# Patient Record
Sex: Female | Born: 1965
Health system: Southern US, Community
[De-identification: ages and names within clinical notes are randomized; demographics above are authoritative.]

## PROBLEM LIST (undated history)

## (undated) DIAGNOSIS — K219 Gastro-esophageal reflux disease without esophagitis: Secondary | ICD-10-CM

## (undated) DIAGNOSIS — R3915 Urgency of urination: Secondary | ICD-10-CM

## (undated) DIAGNOSIS — R319 Hematuria, unspecified: Secondary | ICD-10-CM

## (undated) DIAGNOSIS — Z87442 Personal history of urinary calculi: Secondary | ICD-10-CM

## (undated) DIAGNOSIS — K449 Diaphragmatic hernia without obstruction or gangrene: Secondary | ICD-10-CM

## (undated) DIAGNOSIS — F419 Anxiety disorder, unspecified: Secondary | ICD-10-CM

## (undated) DIAGNOSIS — N200 Calculus of kidney: Secondary | ICD-10-CM

## (undated) DIAGNOSIS — Z8489 Family history of other specified conditions: Secondary | ICD-10-CM

## (undated) HISTORY — PX: COLONOSCOPY WITH PROPOFOL: SHX5780

## (undated) HISTORY — PX: CYSTOSCOPY/RETROGRADE/URETEROSCOPY/STONE EXTRACTION WITH BASKET: SHX5317

## (undated) HISTORY — PX: KIDNEY STONE SURGERY: SHX686

## (undated) HISTORY — PX: ESOPHAGOGASTRODUODENOSCOPY: SHX1529

## (undated) HISTORY — PX: TONSILLECTOMY: SUR1361

---

## 2000-04-24 ENCOUNTER — Encounter (INDEPENDENT_AMBULATORY_CARE_PROVIDER_SITE_OTHER): Payer: Self-pay | Admitting: Specialist

## 2000-04-24 ENCOUNTER — Inpatient Hospital Stay (HOSPITAL_COMMUNITY): Admission: AD | Admit: 2000-04-24 | Discharge: 2000-04-24 | Payer: Self-pay | Admitting: Obstetrics & Gynecology

## 2000-04-24 ENCOUNTER — Encounter: Payer: Self-pay | Admitting: Obstetrics & Gynecology

## 2001-02-11 ENCOUNTER — Other Ambulatory Visit: Admission: RE | Admit: 2001-02-11 | Discharge: 2001-02-11 | Payer: Self-pay | Admitting: Obstetrics and Gynecology

## 2001-03-04 ENCOUNTER — Encounter: Payer: Self-pay | Admitting: Obstetrics and Gynecology

## 2001-03-04 ENCOUNTER — Ambulatory Visit (HOSPITAL_COMMUNITY): Admission: RE | Admit: 2001-03-04 | Discharge: 2001-03-04 | Payer: Self-pay | Admitting: Obstetrics and Gynecology

## 2001-12-19 ENCOUNTER — Inpatient Hospital Stay (HOSPITAL_COMMUNITY): Admission: AD | Admit: 2001-12-19 | Discharge: 2001-12-21 | Payer: Self-pay | Admitting: Obstetrics and Gynecology

## 2002-01-30 ENCOUNTER — Other Ambulatory Visit: Admission: RE | Admit: 2002-01-30 | Discharge: 2002-01-30 | Payer: Self-pay | Admitting: Obstetrics and Gynecology

## 2003-05-01 ENCOUNTER — Other Ambulatory Visit: Admission: RE | Admit: 2003-05-01 | Discharge: 2003-05-01 | Payer: Self-pay | Admitting: Obstetrics and Gynecology

## 2004-01-09 ENCOUNTER — Other Ambulatory Visit: Admission: RE | Admit: 2004-01-09 | Discharge: 2004-01-09 | Payer: Self-pay | Admitting: Obstetrics and Gynecology

## 2004-07-15 ENCOUNTER — Inpatient Hospital Stay (HOSPITAL_COMMUNITY): Admission: AD | Admit: 2004-07-15 | Discharge: 2004-07-18 | Payer: Self-pay | Admitting: Obstetrics and Gynecology

## 2004-08-03 HISTORY — PX: ENDOMETRIAL ABLATION: SHX621

## 2004-08-29 ENCOUNTER — Other Ambulatory Visit: Admission: RE | Admit: 2004-08-29 | Discharge: 2004-08-29 | Payer: Self-pay | Admitting: Obstetrics and Gynecology

## 2005-10-12 ENCOUNTER — Other Ambulatory Visit: Admission: RE | Admit: 2005-10-12 | Discharge: 2005-10-12 | Payer: Self-pay | Admitting: Obstetrics and Gynecology

## 2006-01-28 ENCOUNTER — Emergency Department (HOSPITAL_COMMUNITY): Admission: EM | Admit: 2006-01-28 | Discharge: 2006-01-29 | Payer: Self-pay | Admitting: Emergency Medicine

## 2006-02-01 ENCOUNTER — Ambulatory Visit (HOSPITAL_BASED_OUTPATIENT_CLINIC_OR_DEPARTMENT_OTHER): Admission: RE | Admit: 2006-02-01 | Discharge: 2006-02-01 | Payer: Self-pay | Admitting: Urology

## 2006-09-10 ENCOUNTER — Encounter: Admission: RE | Admit: 2006-09-10 | Discharge: 2006-09-10 | Payer: Self-pay | Admitting: Obstetrics and Gynecology

## 2010-02-14 ENCOUNTER — Encounter: Admission: RE | Admit: 2010-02-14 | Discharge: 2010-02-14 | Payer: Self-pay | Admitting: Obstetrics and Gynecology

## 2010-08-24 ENCOUNTER — Encounter: Payer: Self-pay | Admitting: Obstetrics and Gynecology

## 2010-12-19 NOTE — Op Note (Signed)
[facility]  Patient:    Christine Cooley, Christine Cooley                      MRN: [id number] Proc. Date: 04/24/00 Adm. Date:  [id number] Attending:  Minette Headland                           Operative Report  PREOPERATIVE DIAGNOSIS:       Incomplete spontaneous abortion.  POSTOPERATIVE DIAGNOSIS:      Incomplete spontaneous abortion.  OPERATION:                    Gentle curettage of the endometrial cavity with the patient in the triage area of hospital.  SURGEON:                      Cooley. Varney Baas, M.D.  ANESTHESIA:                   Intravenous Stadol with Phenergan.  ESTIMATED INTRAOPERATIVE BLOOD LOSS:  20 cc or less.  INTRAOPERATIVE COMPLICATIONS: None.  INDICATIONS:                  The patient is a 45 year old primigravida who had called earlier in the day complaining of vaginal spotting which had begun of September 21.  She came to the hospital and there had an ultrasound which showed an essentially normal pelvis and a thickened endometrial stripe but no definite parts of conception within the uterine cavity.  She was sent home with planned followup with serial quantitative HCGs on Monday of this coming week. She presented again with heavy vaginal bleeding, clots, and severe cramping pain.  DESCRIPTION OF PROCEDURE:     After careful discussion with the patient, she was give 2 g of Stadol IV and 12.5 mg Phenergan IV.  This produced adequate pain relief.  She was placed in the dorsolithotomy position on the table in the triage area, and a bivalve speculum was introduced.  The cervix was visualized.  What appeared to be small parts of conception were noted in the vaginal vault.  The cervix was grasped with a single-tooth tenaculum, and using a small Heaney curette, gentle curettage of the endometrial cavity was carried out with a small amount of additional material being removed.  This was submitted for histologic examination.  The instruments  were removed.  The patient was observed in the triage area for approximately 30 minutes and was stable.  Her condition was considered to be satisfactory.  She was discharged home with routine outpatient surgical instructions for D&C and instructions to avoid vaginal entry, call for fever or heavy bleeding, and to return to the office in three to five days for postoperative visit. DD:  04/24/00 TD:  04/24/00 Job: [id number]

## 2010-12-19 NOTE — H&P (Signed)
NAME:  Christine Cooley, Christine Cooley               ACCOUNT NO.:  [id number]   MEDICAL RECORD NO.:  [id number]          PATIENT TYPE:  INP   LOCATION:  9141                          FACILITY:  [facility]   PHYSICIAN:  Duke Salvia. Marcelle Overlie, M.D.DATE OF BIRTH:  20-Feb-1966   DATE OF ADMISSION:  07/15/2004  DATE OF DISCHARGE:                                HISTORY & PHYSICAL   CHIEF COMPLAINT:  Labor.   HISTORY OF PRESENT ILLNESS:  [age]-year-old G3, P1-0-1-1, EDD is  August 05, 2004, who presents with labor after having membranes stripped in  the office earlier today.  Her prenatal course has been uneventful.  She did  not have early trisomy 21 screening, but her quad screen, AFP and early  screening ultrasound were normal.  One-hour GTT was 93.  GBS was negative.  Rubella titer is positive.   PAST MEDICAL HISTORY:   ALLERGIES:  ERYTHROMYCIN.   PRIOR SURGERY:  1.  D&C in '01.  2.  Vaginal delivery of 7-pound 11-ounce female, May of '03.   REVIEW OF SYSTEMS:  Review of systems significant for CRYO for HPV at age  45, tonsillectomy.   PHYSICAL EXAM:  VITAL SIGNS:  Temperature 98.2, blood pressure 110/80.  HEENT:  Unremarkable.  NECK:  Neck supple without mass.  LUNGS:  Lungs clear.  CARDIOVASCULAR:  Regular rate and rhythm without murmurs, rubs, or gallops  noted.  BREASTS:  Not examined.  ABDOMEN:  Term fundal height.  Fetal heart rate 140.  PELVIC:  Cervix on admission was 3.5 to 4, complete, membranes bulging,  contractions every 2-3 minutes.  EXTREMITIES:  Unremarkable.  NEUROLOGIC:  Unremarkable.   IMPRESSION:  1.  Term intrauterine pregnancy, labor.  2.  Group beta streptococcus negative.   PLAN:  Admit.  Anticipate vaginal delivery.  The patient requests epidural  anesthesia.     Rich  RMH/MEDQ  D:  07/16/2004  T:  07/16/2004  Job:  [id number]

## 2010-12-19 NOTE — Op Note (Signed)
NAME:  [place], Roisin               ACCOUNT NO.:  [id number]   MEDICAL RECORD NO.:  [id number]          PATIENT TYPE:  INP   LOCATION:  9141                          FACILITY:  [facility]   PHYSICIAN:  Duke Salvia. Marcelle Overlie, M.D.DATE OF BIRTH:  04/05/1966   DATE OF PROCEDURE:  07/16/2004  DATE OF DISCHARGE:                                 OPERATIVE REPORT   DELIVERY NOTE:  This patient had epidural anesthesia, was still having  significant pain as she was pushing.  Was noted to be crowning and +3  station.  Due to extreme discomfort, she requested the assisted delivery,  which we discussed.  Her Foley catheter had just been removed prior to her  pushing effort.  Perineal anesthesia was aided with 1% Xylocaine.  A small  midline episiotomy was made after she had been pushing and requested  assistance.  The Kiwi VE was applied, traction efforts coordinated with  maternal pushing efforts.  Two traction efforts to effect delivery of a  female in the straight OP presentation.  The infant was suctioned, cord  clamped, placed on the mother's abdomen.  Apgars 8 and 9.  The pH was sent.  The placenta delivered spontaneously intact.  The cervix and upper vagina  were unremarkable.  The episiotomy did not extend and was repaired in the  standard layered fashion with 3-0 Vicryl Rapide.  The mother and baby are  doing well at that point.     Rich   RMH/MEDQ  D:  07/16/2004  T:  07/16/2004  Job:  [id number]

## 2010-12-19 NOTE — Op Note (Signed)
NAME:  Christine Cooley, Christine Cooley               ACCOUNT NO.:  [id number]   MEDICAL RECORD NO.:  [id number]          PATIENT TYPE:  AMB   LOCATION:  NESC                         [facility]ACILITY:  [facility]   PHYSICIAN:  Mark C. Vernie Ammons, M.D.  DATE OF BIRTH:  [birth year]   DATE OF PROCEDURE:  02/01/2006  DATE OF DISCHARGE:                                 OPERATIVE REPORT   PREOPERATIVE DIAGNOSIS:  Left ureteral calculus.   POSTOPERATIVE DIAGNOSIS:  Left ureteral calculus.   PROCEDURE:  Cystoscopy, left retrograde pyelogram with interpretation, left  ureteroscopy, laser lithotripsy, stone extraction, and double-J stent  placement.   SURGEON:  Dr. Vernie Ammons   ANESTHESIA:  General.   BLOOD LOSS:  Minimal.   SPECIMENS:  Stone to be sent for stone analysis.   DRAINS:  6-French 24 cm double-J stent in the left ureter (with string).   COMPLICATIONS:  None.   INDICATIONS:  The patient is 45 year old white female who had acute onset  left flank pain.  She was found to have punctate stones in her kidneys as  well as a 4-mm stone in the distal left ureter with what appeared to be some  possible partial duplication and a possible second stone in an adjacent  duplicated ureter versus pelvic phlebolith.  She has not passed her stone.  She is brought to the OR today for stone extraction.  I discussed the list  of the risks, complications, and alternatives.   DESCRIPTION OF OPERATION:  After informed consent, the patient brought to  the major OR, administered general anesthesia, then moved to the dorsal  lithotomy position.  Her genitalia was sterilely prepped and draped, and  initially a 22-French cystoscope was introduced in the bladder.  The bladder  was then fully inspect and noted be free of any tumor, stones, or  inflammatory lesions.  The left orifice was then identified, and it appeared  to be somewhat more mounded up, although not edematous, indicating possible  stone close to the intramural ureter.   A  retrograde pyelogram was then performed using full strength Renografin by  passing a 6-French open-ended ureteral catheter through the cystoscope and  into the left ureteral orifice.  There was a single orifice on the left-hand  side and a single orifice on the right.  As I injected contrast, I noted a  filling defect, consistent with a stone and a single mildly hydronephrosis  ureter proximal to the stone up to the level of the renal pelvis that was  bifid.  No abnormalities of the collecting system were noted otherwise.  No  filling defects or mass effect was present.   I removed the cystoscope and inserted the 6-French short rigid ureteroscope.  I was able to easily pass this into the left ureter and identified the stone  and photographed it.  I initially tried to grasp the stone with a nitinol  basket but was unsuccessful due to its size and position in the ureter.  I  therefore removed the nitinol basket and inserted 365 micron holmium laser  fiber through the ureteroscope and fragmented the stone completely.  I then  passed the nitinol basket and grasped some stone fragments to be sent for  analysis and the remaining small sand-like fragments then passed on down the  ureter as I removed the basket.  I reinspected the ureter with the  ureteroscope, found no injury or perforation.  No further stone fragments  remained.  I therefore passed the 0.038 inch floppy tip guidewire up the  left ureter into the area of the renal pelvis under direct fluoroscopic  control and then passed this double-J stent over the guidewire into the  renal pelvis.  As I removed the guidewire, a good curl was noted in the  renal pelvis and in the bladder.  The bladder was then drained, and the  string that was left affixed to the distal aspect of the stent was then  taped to the inner thigh.  The patient was awakened and taken to the  recovery room in stable satisfactory condition.  She tolerated the procedure   well, and there were no intraoperative complications.   She will be given a prescription for Pyridium plus #38 and will return to my  office on Thursday for stent removal via the string.  I will then send her  stone for analysis time, contact her with those results, and then plan to  see her back in 6 months for KUB to follow the remaining stones.      Mark C. Vernie Ammons, M.D.  Electronically Signed     MCO/MEDQ  D:  02/01/2006  T:  02/01/2006  Job:  [id number]

## 2010-12-19 NOTE — H&P (Signed)
NAME:  Christine Cooley, Christine Cooley               ACCOUNT NO.:  [id number]   MEDICAL RECORD NO.:  [id number]          PATIENT TYPE:  AMB   LOCATION:  NESC                         FACILITY:  [facility]   PHYSICIAN:  Mark C. Vernie Ammons, M.D.  DATE OF BIRTH:  [birth year]   DATE OF ADMISSION:  DATE OF DISCHARGE:                                HISTORY & PHYSICAL   HISTORY:  The patient is a 46 year old white female who was seen in [facility] emergency room yesterday with severe left lower quadrant and flank  pain.  She states that on Wednesday she had severe pain in the left lower  quadrant.  She went to see her gynecologist who obtained a pelvic ultrasound  and that revealed normal ovaries and uterus.  She was scheduled for a CT  scan, although before that could be obtained she developed severe nausea,  vomiting, but no fever.  She was having some chills.  She was also having  severe left flank pain last night and went to the emergency room.  She has  no history of kidney stones and was treated there with resolution of her  pain.  She has not had any irritative voiding symptoms and she denies any  gross hematuria.  She does report some what sounds like stress incontinence  as well.   PAST MEDICAL HISTORY:  Positive for HPV; otherwise negative.   SURGICAL HISTORY:  She has had tonsillectomy and cervical freezing of the  cervix at 24 for her HPV.   MEDICATIONS:  She takes birth control pills and she was given a recent  prescription for hydromorphone and Phenergan.   ALLERGIES:  ERYTHROMYCIN causes stomach ache, but no true allergies.   SOCIAL HISTORY:  No tobacco or ethanol.   FAMILY HISTORY:  Father is 71, mother 67.  There is heart disease,  hypertension and kidney stones in her family; her father had kidney stones.   REVIEW OF SYSTEMS:  Positive for frequency, urgency and incontinence with  cough, as well as abdominal pain, nausea, vomiting, chills, weight loss,  weakness and tiredness.  Otherwise,  her review of systems is completely  negative.   Urinalysis done in my office had positive blood and protein, positive  leukocyte esterase, 5-15 white cells, 16-50 red cells.  The urinalysis done  in the emergency room was negative other than trace leukocyte esterase and  21-50 red cells.   EXAMINATION:  VITAL SIGNS:  Blood pressure is 104/72, pulse 88, temperature  97.2.  GENERAL:  The patient is a thin white female in no apparent distress.  HEENT:  Atraumatic, normocephalic.  Oropharynx clear.  NECK:  Supple with midline trachea.  CHEST:  Reveals normal respiratory effort.  CARDIOVASCULAR:  Regular rate and rhythm.  ABDOMEN:  Soft and nontender without mass or HSM.  She had mild left CVAT to  percussion.  GENITOURINARY:  She has normal external female genitalia.  EXTREMITIES:  Without clubbing, cyanosis, edema.  NEUEO:  She is alert and oriented with appropriate mood and affect, has no  focal neurologic deficits.   CT scan:  Study done on June  28 at [facility] was reviewed.  There are two  punctate renal calculi, one on the right, one the left.  There is moderate  upper pole hydronephrosis on the left-hand side and what appears to be  partial duplication of the left collecting system.  There appeared to be  either two distal ureteral calculi in a single or duplicated ureter, the  largest measures 4 mm.   IMPRESSION:  1.  Bilateral renal calculi.  2.  Stress urinary continence.  3.  She has with sounds like possible mild bladder instability.  4.  Left ureteral calculi causing hydronephrosis and intermittent renal      colic.  We therefore discussed treatment options.  I have discussed      lithotripsy, ureteroscopy and observation.  We discussed the risks and      complications of ureteroscopy as well as the possible need for a stent      and the patient understands and would like to proceed with this if she      does not pass her stone.   PLAN:  1.  I am going to try to  improve her possibilities of stone passage with      UroXatral 10 mg a day.  I gave her samples of that today.  2.  She is going to take the hydromorphone she has.  3.  She will force fluids and be given a urine strainer to strain her urine,      contact me if she passes her stone.  4.  If she does not pass her stone by Monday I will plan to do ureteroscopic      extraction of the stone with possible laser lithotripsy and stent      placement.      Mark C. Vernie Ammons, M.D.  Electronically Signed     MCO/MEDQ  D:  01/29/2006  T:  01/29/2006  Job:  [id number]

## 2011-02-27 ENCOUNTER — Other Ambulatory Visit: Payer: Self-pay | Admitting: Obstetrics and Gynecology

## 2011-02-27 DIAGNOSIS — Z1231 Encounter for screening mammogram for malignant neoplasm of breast: Secondary | ICD-10-CM

## 2011-03-04 ENCOUNTER — Ambulatory Visit
Admission: RE | Admit: 2011-03-04 | Discharge: 2011-03-04 | Disposition: A | Discharge status: Home / Self Care | Payer: BC Managed Care – PPO | Source: Ambulatory Visit | Attending: Obstetrics and Gynecology | Admitting: Obstetrics and Gynecology

## 2011-03-04 DIAGNOSIS — Z1231 Encounter for screening mammogram for malignant neoplasm of breast: Secondary | ICD-10-CM

## 2012-02-24 ENCOUNTER — Other Ambulatory Visit: Payer: Self-pay | Admitting: Obstetrics and Gynecology

## 2012-02-24 DIAGNOSIS — Z1231 Encounter for screening mammogram for malignant neoplasm of breast: Secondary | ICD-10-CM

## 2012-03-30 ENCOUNTER — Ambulatory Visit: Payer: BC Managed Care – PPO

## 2012-04-15 ENCOUNTER — Ambulatory Visit
Admission: RE | Admit: 2012-04-15 | Discharge: 2012-04-15 | Disposition: A | Discharge status: Home / Self Care | Payer: BC Managed Care – PPO | Source: Ambulatory Visit | Attending: Obstetrics and Gynecology | Admitting: Obstetrics and Gynecology

## 2012-04-15 DIAGNOSIS — Z1231 Encounter for screening mammogram for malignant neoplasm of breast: Secondary | ICD-10-CM

## 2012-05-05 ENCOUNTER — Other Ambulatory Visit: Payer: Self-pay | Admitting: Otolaryngology

## 2012-05-05 ENCOUNTER — Ambulatory Visit
Admission: RE | Admit: 2012-05-05 | Discharge: 2012-05-05 | Disposition: A | Discharge status: Home / Self Care | Payer: BC Managed Care – PPO | Source: Ambulatory Visit | Attending: Otolaryngology | Admitting: Otolaryngology

## 2012-05-05 DIAGNOSIS — H905 Unspecified sensorineural hearing loss: Secondary | ICD-10-CM

## 2012-05-05 DIAGNOSIS — H903 Sensorineural hearing loss, bilateral: Secondary | ICD-10-CM

## 2012-05-05 MED ORDER — GADOBENATE DIMEGLUMINE 529 MG/ML IV SOLN
9.0000 mL | Freq: Once | INTRAVENOUS | Status: AC | PRN
  Administered 2012-05-05: 9 mL via INTRAVENOUS

## 2013-03-14 ENCOUNTER — Other Ambulatory Visit: Payer: Self-pay

## 2013-03-14 DIAGNOSIS — Z1231 Encounter for screening mammogram for malignant neoplasm of breast: Secondary | ICD-10-CM

## 2013-04-17 ENCOUNTER — Ambulatory Visit
Admission: RE | Admit: 2013-04-17 | Discharge: 2013-04-17 | Disposition: A | Discharge status: Home / Self Care | Payer: BC Managed Care – PPO | Source: Ambulatory Visit

## 2013-04-17 DIAGNOSIS — Z1231 Encounter for screening mammogram for malignant neoplasm of breast: Secondary | ICD-10-CM

## 2014-04-03 ENCOUNTER — Other Ambulatory Visit: Payer: Self-pay

## 2014-04-03 DIAGNOSIS — Z1231 Encounter for screening mammogram for malignant neoplasm of breast: Secondary | ICD-10-CM

## 2014-04-19 ENCOUNTER — Ambulatory Visit
Admission: RE | Admit: 2014-04-19 | Discharge: 2014-04-19 | Disposition: A | Discharge status: Home / Self Care | Payer: BC Managed Care – PPO | Source: Ambulatory Visit

## 2014-04-19 DIAGNOSIS — Z1231 Encounter for screening mammogram for malignant neoplasm of breast: Secondary | ICD-10-CM

## 2015-03-25 ENCOUNTER — Other Ambulatory Visit: Payer: Self-pay

## 2015-03-25 DIAGNOSIS — Z1231 Encounter for screening mammogram for malignant neoplasm of breast: Secondary | ICD-10-CM

## 2015-05-06 ENCOUNTER — Ambulatory Visit
Admission: RE | Admit: 2015-05-06 | Discharge: 2015-05-06 | Disposition: A | Discharge status: Home / Self Care | Payer: BLUE CROSS/BLUE SHIELD | Source: Ambulatory Visit

## 2015-05-06 DIAGNOSIS — Z1231 Encounter for screening mammogram for malignant neoplasm of breast: Secondary | ICD-10-CM

## 2016-03-31 ENCOUNTER — Other Ambulatory Visit: Payer: Self-pay | Admitting: Obstetrics and Gynecology

## 2016-03-31 ENCOUNTER — Other Ambulatory Visit: Payer: Self-pay | Admitting: Nurse Practitioner

## 2016-03-31 DIAGNOSIS — Z1231 Encounter for screening mammogram for malignant neoplasm of breast: Secondary | ICD-10-CM

## 2016-05-07 ENCOUNTER — Ambulatory Visit
Admission: RE | Admit: 2016-05-07 | Discharge: 2016-05-07 | Disposition: A | Discharge status: Home / Self Care | Payer: BLUE CROSS/BLUE SHIELD | Source: Ambulatory Visit | Attending: Nurse Practitioner | Admitting: Nurse Practitioner

## 2016-05-07 DIAGNOSIS — Z1231 Encounter for screening mammogram for malignant neoplasm of breast: Secondary | ICD-10-CM

## 2017-05-04 ENCOUNTER — Other Ambulatory Visit: Payer: Self-pay | Admitting: Obstetrics and Gynecology

## 2017-05-04 DIAGNOSIS — Z1231 Encounter for screening mammogram for malignant neoplasm of breast: Secondary | ICD-10-CM

## 2017-05-21 ENCOUNTER — Ambulatory Visit
Admission: RE | Admit: 2017-05-21 | Discharge: 2017-05-21 | Disposition: A | Discharge status: Home / Self Care | Payer: BLUE CROSS/BLUE SHIELD | Source: Ambulatory Visit | Attending: Obstetrics and Gynecology | Admitting: Obstetrics and Gynecology

## 2017-05-21 DIAGNOSIS — Z1231 Encounter for screening mammogram for malignant neoplasm of breast: Secondary | ICD-10-CM

## 2017-11-11 ENCOUNTER — Emergency Department (HOSPITAL_COMMUNITY): Payer: BLUE CROSS/BLUE SHIELD

## 2017-11-11 ENCOUNTER — Other Ambulatory Visit: Payer: Self-pay

## 2017-11-11 ENCOUNTER — Encounter (HOSPITAL_COMMUNITY): Payer: Self-pay | Admitting: Student

## 2017-11-11 ENCOUNTER — Emergency Department (HOSPITAL_COMMUNITY)
Admission: EM | Admit: 2017-11-11 | Discharge: 2017-11-11 | Disposition: A | Discharge status: Home / Self Care | Payer: BLUE CROSS/BLUE SHIELD | Attending: Emergency Medicine | Admitting: Emergency Medicine

## 2017-11-11 DIAGNOSIS — R0789 Other chest pain: Secondary | ICD-10-CM | POA: Diagnosis present

## 2017-11-11 DIAGNOSIS — Z79899 Other long term (current) drug therapy: Secondary | ICD-10-CM | POA: Diagnosis not present

## 2017-11-11 DIAGNOSIS — R072 Precordial pain: Secondary | ICD-10-CM | POA: Insufficient documentation

## 2017-11-11 HISTORY — DX: Gastro-esophageal reflux disease without esophagitis: K21.9

## 2017-11-11 LAB — CBC
HEMATOCRIT: 42.6 % (ref 36.0–46.0)
HEMOGLOBIN: 14.4 g/dL (ref 12.0–15.0)
MCH: 31.6 pg (ref 26.0–34.0)
MCHC: 33.8 g/dL (ref 30.0–36.0)
MCV: 93.6 fL (ref 78.0–100.0)
Platelets: 250 10*3/uL (ref 150–400)
RBC: 4.55 MIL/uL (ref 3.87–5.11)
RDW: 12 % (ref 11.5–15.5)
WBC: 8.1 10*3/uL (ref 4.0–10.5)

## 2017-11-11 LAB — URINALYSIS, ROUTINE W REFLEX MICROSCOPIC
Bilirubin Urine: NEGATIVE
GLUCOSE, UA: NEGATIVE mg/dL
KETONES UR: 80 mg/dL — AB
Leukocytes, UA: NEGATIVE
NITRITE: NEGATIVE
PH: 7 (ref 5.0–8.0)
PROTEIN: NEGATIVE mg/dL
Specific Gravity, Urine: 1.041 — ABNORMAL HIGH (ref 1.005–1.030)

## 2017-11-11 LAB — D-DIMER, QUANTITATIVE: D-Dimer, Quant: 3.28 ug/mL-FEU — ABNORMAL HIGH (ref 0.00–0.50)

## 2017-11-11 LAB — BASIC METABOLIC PANEL
Anion gap: 11 (ref 5–15)
BUN: 21 mg/dL — AB (ref 6–20)
CHLORIDE: 105 mmol/L (ref 101–111)
CO2: 22 mmol/L (ref 22–32)
Calcium: 9.4 mg/dL (ref 8.9–10.3)
Creatinine, Ser: 0.71 mg/dL (ref 0.44–1.00)
GFR calc Af Amer: >60 mL/min (ref >60)
GFR calc non Af Amer: >60 mL/min (ref >60)
Glucose, Bld: 103 mg/dL — ABNORMAL HIGH (ref 65–99)
POTASSIUM: 4 mmol/L (ref 3.5–5.1)
SODIUM: 138 mmol/L (ref 135–145)

## 2017-11-11 LAB — DIFFERENTIAL
BASOS ABS: 0 10*3/uL (ref 0.0–0.1)
BASOS PCT: 0 %
Eosinophils Absolute: 0 10*3/uL (ref 0.0–0.7)
Eosinophils Relative: 0 %
LYMPHS PCT: 11 %
Lymphs Abs: 0.9 10*3/uL (ref 0.7–4.0)
Monocytes Absolute: 0.3 10*3/uL (ref 0.1–1.0)
Monocytes Relative: 4 %
NEUTROS ABS: 6.5 10*3/uL (ref 1.7–7.7)
NEUTROS PCT: 85 %

## 2017-11-11 LAB — HEPATIC FUNCTION PANEL
ALBUMIN: 4.4 g/dL (ref 3.5–5.0)
ALK PHOS: 57 U/L (ref 38–126)
ALT: 20 U/L (ref 14–54)
AST: 21 U/L (ref 15–41)
BILIRUBIN INDIRECT: 1.2 mg/dL — AB (ref 0.3–0.9)
Bilirubin, Direct: 0.2 mg/dL (ref 0.1–0.5)
TOTAL PROTEIN: 7.7 g/dL (ref 6.5–8.1)
Total Bilirubin: 1.4 mg/dL — ABNORMAL HIGH (ref 0.3–1.2)

## 2017-11-11 LAB — I-STAT TROPONIN, ED
Troponin i, poc: 0 ng/mL (ref 0.00–0.08)
Troponin i, poc: 0 ng/mL (ref 0.00–0.08)

## 2017-11-11 MED ORDER — ONDANSETRON 4 MG PO TBDP: ORAL_TABLET | ORAL | 0 refills | Status: DC

## 2017-11-11 MED ORDER — NITROGLYCERIN 0.4 MG SL SUBL
0.4000 mg | SUBLINGUAL_TABLET | SUBLINGUAL | Status: DC | PRN
  Administered 2017-11-11: 0.4 mg via SUBLINGUAL
  Filled 2017-11-11: qty 1

## 2017-11-11 MED ORDER — IOPAMIDOL (ISOVUE-370) INJECTION 76%
INTRAVENOUS | Status: AC
  Filled 2017-11-11: qty 100

## 2017-11-11 MED ORDER — IOPAMIDOL (ISOVUE-370) INJECTION 76%
100.0000 mL | Freq: Once | INTRAVENOUS | Status: AC | PRN
  Administered 2017-11-11: 100 mL via INTRAVENOUS

## 2017-11-11 MED ORDER — SODIUM CHLORIDE 0.9 % IV BOLUS
500.0000 mL | Freq: Once | INTRAVENOUS | Status: AC
  Administered 2017-11-11: 500 mL via INTRAVENOUS

## 2017-11-11 MED ORDER — SODIUM CHLORIDE 0.9 % IJ SOLN
INTRAMUSCULAR | Status: AC
  Filled 2017-11-11: qty 50

## 2017-11-11 MED ORDER — ONDANSETRON HCL 4 MG/2ML IJ SOLN
4.0000 mg | Freq: Once | INTRAMUSCULAR | Status: AC
  Administered 2017-11-11: 4 mg via INTRAVENOUS
  Filled 2017-11-11: qty 2

## 2017-11-11 MED ORDER — LORAZEPAM 2 MG/ML IJ SOLN
0.5000 mg | Freq: Once | INTRAMUSCULAR | Status: AC
  Administered 2017-11-11: 0.5 mg via INTRAVENOUS
  Filled 2017-11-11: qty 1

## 2017-11-11 MED ORDER — LORAZEPAM 0.5 MG PO TABS: ORAL_TABLET | ORAL | 0 refills | Status: DC

## 2017-11-11 NOTE — ED Notes (Signed)
ED Provider at bedside. 

## 2017-11-11 NOTE — Discharge Instructions (Addendum)
Follow up with Dr. Anne FuSkains or one of his partners in 1-2 weeks

## 2017-11-11 NOTE — ED Notes (Signed)
This Clinical research associatewriter was present at triage; agrees with BellSoutheeters assessment.

## 2017-11-11 NOTE — ED Notes (Signed)
Patient transported to CT 

## 2017-11-11 NOTE — ED Provider Notes (Signed)
[facility]-EMERGENCY DEPT Provider Note   CSN: [id number] Arrival date & time: 11/11/17  7829     History   Chief Complaint Chief Complaint  Patient presents with  . Chest Pain    HPI Christine Cooley is a 52 y.o. female.  Patient complains of some chest pressure off and on for the last couple days but more severe today.  The history is provided by the patient. No language interpreter was used.  Chest Pain   This is a new problem. The current episode started more than 2 days ago. The problem occurs constantly. The problem has not changed since onset.The pain is associated with exertion. The pain is present in the substernal region. The pain is moderate. The quality of the pain is described as dull. The pain does not radiate. Pertinent negatives include no abdominal pain, no back pain, no cough and no headaches.  Pertinent negatives for past medical history include no seizures.    Past Medical History:  Diagnosis Date  . GERD (gastroesophageal reflux disease)     There are no active problems to display for this patient.   Past Surgical History:  Procedure Laterality Date  . KIDNEY STONE SURGERY    . TONSILLECTOMY       OB History   None      Home Medications    Prior to Admission medications   Medication Sig Start Date End Date Taking? Authorizing Provider  cholecalciferol (VITAMIN D) 1000 units tablet Take 1,000 Units by mouth daily.   Yes [provider]  omeprazole (PRILOSEC) 20 MG capsule Take 20 mg by mouth daily.   Yes [provider]  LORazepam (ATIVAN) 0.5 MG tablet Take one every 8-12 hours if stress or anxiety 11/11/17   Bethann Berkshire, MD    Family History No family history on file.  Social History Social History   Tobacco Use  . Smoking status: Not on file  Substance Use Topics  . Alcohol use: Not on file  . Drug use: Not on file     Allergies   Erythromycin   Review of Systems Review of Systems   Constitutional: Negative for appetite change and fatigue.  HENT: Negative for congestion, ear discharge and sinus pressure.   Eyes: Negative for discharge.  Respiratory: Negative for cough.   Cardiovascular: Positive for chest pain.  Gastrointestinal: Negative for abdominal pain and diarrhea.  Genitourinary: Negative for frequency and hematuria.  Musculoskeletal: Negative for back pain.  Skin: Negative for rash.  Neurological: Negative for seizures and headaches.  Psychiatric/Behavioral: Negative for hallucinations.     Physical Exam Updated Vital Signs BP 97/64   Pulse 82   Temp 97.8 F (36.6 C) (Oral)   Resp 18   Ht 5\' 4"  (1.626 m)   Wt 48.1 kg (106 lb)   SpO2 99%   BMI 18.19 kg/m   Physical Exam  Constitutional: She is oriented to person, place, and time. She appears well-developed.  HENT:  Head: Normocephalic.  Eyes: Conjunctivae and EOM are normal. No scleral icterus.  Neck: Neck supple. No thyromegaly present.  Cardiovascular: Normal rate and regular rhythm. Exam reveals no gallop and no friction rub.  No murmur heard. Pulmonary/Chest: No stridor. She has no wheezes. She has no rales. She exhibits no tenderness.  Abdominal: She exhibits no distension. There is no tenderness. There is no rebound.  Musculoskeletal: Normal range of motion. She exhibits no edema.  Lymphadenopathy:    She has no cervical adenopathy.  Neurological: She is oriented to person, place, and time. She exhibits normal muscle tone. Coordination normal.  Skin: No rash noted. No erythema.  Psychiatric: She has a normal mood and affect. Her behavior is normal.     ED Treatments / Results  Labs (all labs ordered are listed, but only abnormal results are displayed) Labs Reviewed  BASIC METABOLIC PANEL - Abnormal; Notable for the following components:      Result Value   Glucose, Bld 103 (*)    BUN 21 (*)    All other components within normal limits  HEPATIC FUNCTION PANEL - Abnormal;  Notable for the following components:   Total Bilirubin 1.4 (*)    Indirect Bilirubin 1.2 (*)    All other components within normal limits  D-DIMER, QUANTITATIVE (NOT AT [facility]) - Abnormal; Notable for the following components:   D-Dimer, Quant 3.28 (*)    All other components within normal limits  URINALYSIS, ROUTINE W REFLEX MICROSCOPIC - Abnormal; Notable for the following components:   Specific Gravity, Urine 1.041 (*)    Hgb urine dipstick SMALL (*)    Ketones, ur 80 (*)    Bacteria, UA RARE (*)    Squamous Epithelial / LPF 0-5 (*)    All other components within normal limits  CBC  DIFFERENTIAL  I-STAT TROPONIN, ED  I-STAT TROPONIN, ED    EKG EKG Interpretation  Date/Time:  Thursday November 11 2017 08:45:13 EDT Ventricular Rate:  103 PR Interval:    QRS Duration: 89 QT Interval:  321 QTC Calculation: 429 R Axis:   74 Text Interpretation:  Sinus tachycardia RSR' in V1 or V2, probably normal variant Repol abnrm suggests ischemia, anterolateral No comparison EKG Confirmed by Rolland Porter [id number]) on 11/11/2017 8:51:46 AM Also confirmed by Rolland Porter ([id number]), editor Sheppard Evens [phone])  on 11/11/2017 2:29:29 PM   Radiology Dg Chest 2 View  Result Date: 11/11/2017 CLINICAL DATA:  Central upper chest pain and sternal pain beginning this morning EXAM: CHEST - 2 VIEW COMPARISON:  None FINDINGS: Normal heart size, mediastinal contours, and pulmonary vascularity. Lungs hyperinflated but clear. No acute infiltrate, pleural effusion, or pneumothorax. Probable LEFT nipple shadow. No acute osseous findings. IMPRESSION: Hyperinflated lungs without acute abnormality. Electronically Signed   By: Ulyses Southward M.D.   On: 11/11/2017 09:11   Ct Angio Chest Pe W And/or Wo Contrast  Result Date: 11/11/2017 CLINICAL DATA:  52 year old female with pain initially beginning in the cheek 1 week ago, now moving into the chest. Vomiting this morning, nausea. EXAM: CT ANGIOGRAPHY CHEST WITH CONTRAST  TECHNIQUE: Multidetector CT imaging of the chest was performed using the standard protocol during bolus administration of intravenous contrast. Multiplanar CT image reconstructions and MIPs were obtained to evaluate the vascular anatomy. CONTRAST:  ISOVUE-370 IOPAMIDOL (ISOVUE-370) INJECTION 76% COMPARISON:  Chest radiographs 0853 hours today. FINDINGS: Cardiovascular: Adequate contrast bolus timing in the pulmonary arterial tree. Mild respiratory motion in the lower lobes. No focal filling defect identified in the pulmonary arteries to suggest acute pulmonary embolism. No calcified coronary artery atherosclerosis is evident. The visible aorta is normal. Unremarkable proximal great vessels. No cardiomegaly or pericardial effusion. Mediastinum/Nodes: Negative.  No lymphadenopathy. Lungs/Pleura: The major airways are patent. Both lungs are clear aside from minor dependent atelectasis, mostly in the lower lobes. No pleural effusion. Upper Abdomen: Negative visible liver, spleen, adrenal glands, renal upper poles, and stomach in the upper abdomen. Musculoskeletal: Negative. Review of the MIP images confirms the above findings. IMPRESSION: Negative for  acute pulmonary embolus, and negative Chest CTA. Minor pulmonary atelectasis. Electronically Signed   By: Odessa FlemingH  Hall M.D.   On: 11/11/2017 12:06    Procedures Procedures (including critical care time)  Medications Ordered in ED Medications  nitroGLYCERIN (NITROSTAT) SL tablet 0.4 mg (0.4 mg Sublingual Given 11/11/17 0944)  iopamidol (ISOVUE-370) 76 % injection (has no administration in time range)  sodium chloride 0.9 % injection (has no administration in time range)  sodium chloride 0.9 % bolus 500 mL (0 mLs Intravenous Stopped 11/11/17 1027)  ondansetron (ZOFRAN) injection 4 mg (4 mg Intravenous Given 11/11/17 1041)  LORazepam (ATIVAN) injection 0.5 mg (0.5 mg Intravenous Given 11/11/17 1053)  iopamidol (ISOVUE-370) 76 % injection 100 mL (100 mLs Intravenous  Contrast Given 11/11/17 1138)     Initial Impression / Assessment and Plan / ED Course  I have reviewed the triage vital signs and the nursing notes.  Pertinent labs & imaging results that were available during my care of the patient were reviewed by me and considered in my medical decision making (see chart for details).     Labs unremarkable except for elevated d-dimer.  CT scan of the chest was negative.  Patient does have inverted T waves on her EKG.  I spoke with cardiology and since the patient has 2- troponins her symptoms are improved and her CT scan did not show any coronary artery disease it was felt the patient can be discharged home for follow-up in the clinic for possible echo  Final Clinical Impressions(s) / ED Diagnoses   Final diagnoses:  Atypical chest pain    ED Discharge Orders        Ordered    LORazepam (ATIVAN) 0.5 MG tablet     11/11/17 1431       Bethann BerkshireZammit, Kyilee Gregg, MD 11/11/17 1435

## 2017-11-11 NOTE — ED Notes (Signed)
IV attempted x2 without success.

## 2017-11-11 NOTE — ED Triage Notes (Signed)
Per patient Pain began one week ago in the check. Patient described it as a cold, chocking feeling. Over time the pain moved down into the chest. Vomiting this morning relieved some the pain. No complaints of feeling nauseous. Patient also complains of occasional anxiety.

## 2017-11-25 ENCOUNTER — Encounter: Payer: Self-pay | Admitting: Adult Health

## 2017-11-25 ENCOUNTER — Ambulatory Visit (INDEPENDENT_AMBULATORY_CARE_PROVIDER_SITE_OTHER): Payer: BLUE CROSS/BLUE SHIELD | Admitting: Adult Health

## 2017-11-25 ENCOUNTER — Telehealth (HOSPITAL_COMMUNITY): Payer: Self-pay

## 2017-11-25 VITALS — BP 112/70 | HR 61 | Ht 64.0 in | Wt 108.6 lb

## 2017-11-25 DIAGNOSIS — R000 Tachycardia, unspecified: Secondary | ICD-10-CM | POA: Diagnosis not present

## 2017-11-25 DIAGNOSIS — R9431 Abnormal electrocardiogram [ECG] [EKG]: Secondary | ICD-10-CM

## 2017-11-25 DIAGNOSIS — R0789 Other chest pain: Secondary | ICD-10-CM | POA: Diagnosis not present

## 2017-11-25 NOTE — Progress Notes (Signed)
Cardiology Office Note   Date:  11/25/2017   ID:  [name], DOB [birth year], MRN [id number]  PCP:  Nonnie DoneSlatosky, John J., MD  Cardiologist: New-Dr. Herbie BaltimoreHarding  Chief Complaint  Patient presents with  . Chest Pain     History of Present Illness: Christine Cooley is a delightful albeit anxious  52 y.o. female who presents for chest pain was recent ER visit for same on 11/11/2017.  She described the pain at that time as constant for 2 days, not associated with exertion located substernally and described as a dull pain nonradiating.  She also described a "coldness in her throat with tightness" and some upper shoulder pain.   She has a prior history of GERD and nephrolithiasis.  During ER visit she was given sublingual nitroglycerin, Ativan IV x1.  She was ruled out for PE via CT scan.  There is no evidence of ACS but there was some T wave inversion noted on her EKG.  She is here for further cardiac work-up. Review of EKG did reveal T-wave inversion in the lateral leads at high HR.   She has a family history of heart disease in her grandmother, who also had a CVA.  Parents have no history of coronary artery disease.  Her father has Alzheimer's, her mother is well and is taking care of her father without need of assistance yet.  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  Her husband is a patient of Dr. Tresa EndoKelly.  She continues to describe some anxiety related discomfort in her chest.  She has been followed by Dr. Loreta AveMann who is ordered blood this a.m.  This will include checking her thyroid profile.  She shall be low in vitamin D and is now on supplements.  She does not exercise regularly but she remains busy and active throughout the day with her church and school activities with her children.  Exertional activities do not cause her discomfort.  She has been recently placed on Ativan which has been helpful in some  instances with her anxiety.  Past Medical History:  Diagnosis Date  . GERD (gastroesophageal reflux disease)     Past Surgical History:  Procedure Laterality Date  . KIDNEY STONE SURGERY    . TONSILLECTOMY       Current Outpatient Medications  Medication Sig Dispense Refill  . cholecalciferol (VITAMIN D) 1000 units tablet Take 1,000 Units by mouth daily.    Marland Kitchen. LORazepam (ATIVAN) 0.5 MG tablet Take one every 8-12 hours if stress or anxiety 15 tablet 0  . omeprazole (PRILOSEC) 20 MG capsule Take 20 mg by mouth daily.    . ondansetron (ZOFRAN ODT) 4 MG disintegrating tablet 4mg  ODT q4 hours prn nausea/vomit 12 tablet 0   No current facility-administered medications for this visit.     Allergies:   Erythromycin    Social History:  The patient  reports that she has never smoked. She has never used smokeless tobacco.   Family History:  The patient's family history includes Alzheimer's disease in her father; COPD in her father.    ROS: All other systems are reviewed and negative. Unless otherwise mentioned in H&P    PHYSICAL EXAM: VS:  BP 112/70 (BP Location: Left Arm)   Pulse 61   Ht 5\' 4"  (1.626 m)   Wt 108 lb 9.6 oz (49.3 kg)   BMI 18.64 kg/m  ,  BMI Body mass index is 18.64 kg/m. GEN: Well nourished, well developed, in no acute distress  HEENT: normal  Neck: no JVD, carotid bruits, or masses. Prominent larynx, no thyromegaly.   Cardiac: RRR; no murmurs, rubs, or gallops,no edema  Respiratory:  Clear to auscultation bilaterally, normal work of breathing GI: soft, nontender, nondistended, + BS MS: no deformity or atrophy  Skin: warm and dry, no rash Neuro:  Strength and sensation are intact Psych: euthymic mood, full affect   EKG: Normal sinus rhythm with anterior T wave flattening.  Prior EKG during hospitalization evaluation in the ER revealed anterior lateral T wave inversion and high heart rate.  This is normalized on follow-up EKG  Recent Labs: 11/11/2017: ALT  20; BUN 21; Creatinine, Ser 0.71; Hemoglobin 14.4; Platelets 250; Potassium 4.0; Sodium 138    Lipid Panel No results found for: CHOL, TRIG, HDL, CHOLHDL, VLDL, LDLCALC, LDLDIRECT    Wt Readings from Last 3 Encounters:  11/25/17 108 lb 9.6 oz (49.3 kg)  11/11/17 106 lb (48.1 kg)      Other studies Reviewed: I reviewed records from ER and discussed above.  ASSESSMENT AND PLAN:  1.  Chest pain: Typical and atypical symptoms usually associated with anxiety, but not always.  Not associated with exertion.  It does not awaken her at night.  It is nonradiating.  Described as a cold feeling in her throat and on shoulder blades and upper chest.  Nitroglycerin did help while she was in ER along with Ativan.  She now is on daily Ativan but still has some discomfort in her chest on occasion.  EKG does not reveal any severe ST-T wave abnormalities some flattening anterior laterally, but EKG in ER did reveal anterior lateral T wave inversion, and high heart rate.  We will plan nuclear medicine stress Myoview for diagnostic prognostic purposes, and an echocardiogram to evaluate for structural heart disease LV function.  I spent a great deal of time speaking with her concerning the tests and procedure.  She verbalizes understanding and is very anxious to move forward with this to find out pressure the cause of her discomfort.  2.  Anxiety: She is recently been placed on Ativan by PCP.  Also labs to include TSH along with chemistries are ordered by them as well.  We will discuss the results on follow-up.  I have encouraged her to take walks outside to help to reduce stress.  Her husband walks in the evenings when he returns home and he has asked her to join you on several occasions.  I have encouraged her to do so.  Current medicines are reviewed at length with the patient today.  She wishes to be followed by Dr. Tresa Endo as he sees her husband.  I have discussed this case with Dr. Herbie Baltimore who is on-site today  and DOD.  He is in agreement with my assessment and plan.  Labs/ tests ordered today include: Nuclear medicine stress test and echocardiogram.  Bettey Mare. Liborio Nixon, ANP, AACC   11/25/2017 12:50 PM    [facility] [address], [place], [place] [address] Phone: [phone]; Fax: [phone]

## 2017-11-25 NOTE — Patient Instructions (Addendum)
Medication Instructions:  NO CHANGES- Your physician recommends that you continue on your current medications as directed. Please refer to the Current Medication list given to you today.  If you need a refill on your cardiac medications before your next appointment, please call your pharmacy.  Testing/Procedures: Your physician has requested that you have an Exercise Myoview. A cardiac stress test is a cardiological test that measures the heart's ability to respond to external stress in a controlled clinical environment. The stress response is induced by exercise (exercise-treadmill) or by intravenous pharmacological stimulation   For further information please visit [email or web address]. Please follow instructions below.  How to prepare for your Myocardial Perfusion Test:   Do not eat or drink 3 hours prior to your test, except you may have water.  Do not consume products containing caffeine (regular or decaffeinated) 12 hours prior to your test. (ex: coffee, chocolate, sodas, tea).  Do wear comfortable clothes (no dresses or overalls) and walking shoes, tennis shoes preferred (No heels or open toe shoes are allowed).  Do NOT wear cologne, perfume, aftershave, or lotions (deodorant is allowed).  If these instructions are not followed, your test will have to be rescheduled.  If you have questions or concerns about your appointment, you can call the Nuclear Lab at [phone].   Follow-Up: Your physician wants you to follow-up in: AFTER TESTING WITH DR Tresa EndoKELLY -OR- KATHRYN LAWRENCE (NURSE PRACTIONIER), DNP,AACC IF PRIMARY CARDIOLOGIST IS UNAVAILABLE.     Thank you for choosing [facility]!!

## 2017-11-25 NOTE — Telephone Encounter (Signed)
Encounter complete. 

## 2017-11-26 ENCOUNTER — Ambulatory Visit (HOSPITAL_COMMUNITY)
Admission: RE | Admit: 2017-11-26 | Discharge: 2017-11-26 | Disposition: A | Discharge status: Home / Self Care | Payer: BLUE CROSS/BLUE SHIELD | Source: Ambulatory Visit | Attending: Internal Medicine | Admitting: Internal Medicine

## 2017-11-26 DIAGNOSIS — Z9289 Personal history of other medical treatment: Secondary | ICD-10-CM

## 2017-11-26 DIAGNOSIS — R000 Tachycardia, unspecified: Secondary | ICD-10-CM | POA: Diagnosis not present

## 2017-11-26 DIAGNOSIS — K219 Gastro-esophageal reflux disease without esophagitis: Secondary | ICD-10-CM | POA: Diagnosis not present

## 2017-11-26 DIAGNOSIS — R0789 Other chest pain: Secondary | ICD-10-CM | POA: Insufficient documentation

## 2017-11-26 HISTORY — DX: Personal history of other medical treatment: Z92.89

## 2017-11-26 LAB — MYOCARDIAL PERFUSION IMAGING
CHL CUP MPHR: 171 {beats}/min
CHL CUP NUCLEAR SDS: 1
CHL CUP RESTING HR STRESS: 81 {beats}/min
CHL RATE OF PERCEIVED EXERTION: 17
CSEPED: 13 min
CSEPHR: 112 %
Estimated workload: 15.1 METS
Exercise duration (sec): 0 s
LV dias vol: 75 mL (ref 46–106)
LV sys vol: 30 mL
Peak HR: 193 {beats}/min
SRS: 0
SSS: 1
TID: 0.97

## 2017-11-26 MED ORDER — TECHNETIUM TC 99M TETROFOSMIN IV KIT
10.4000 | PACK | Freq: Once | INTRAVENOUS | Status: AC | PRN
  Administered 2017-11-26: 10.4 via INTRAVENOUS
  Filled 2017-11-26: qty 11

## 2017-11-26 MED ORDER — TECHNETIUM TC 99M TETROFOSMIN IV KIT
32.0000 | PACK | Freq: Once | INTRAVENOUS | Status: AC | PRN
  Administered 2017-11-26: 32 via INTRAVENOUS
  Filled 2017-11-26: qty 32

## 2017-12-08 ENCOUNTER — Encounter: Payer: Self-pay | Admitting: Adult Health

## 2017-12-13 ENCOUNTER — Ambulatory Visit: Payer: BLUE CROSS/BLUE SHIELD | Admitting: Adult Health

## 2017-12-13 ENCOUNTER — Encounter

## 2018-03-28 ENCOUNTER — Other Ambulatory Visit: Payer: Self-pay | Admitting: Obstetrics and Gynecology

## 2018-03-28 DIAGNOSIS — Z1231 Encounter for screening mammogram for malignant neoplasm of breast: Secondary | ICD-10-CM

## 2018-04-27 ENCOUNTER — Ambulatory Visit: Payer: BLUE CROSS/BLUE SHIELD

## 2018-05-31 ENCOUNTER — Ambulatory Visit
Admission: RE | Admit: 2018-05-31 | Discharge: 2018-05-31 | Disposition: A | Discharge status: Home / Self Care | Payer: BLUE CROSS/BLUE SHIELD | Source: Ambulatory Visit | Attending: Obstetrics and Gynecology | Admitting: Obstetrics and Gynecology

## 2018-05-31 DIAGNOSIS — Z1231 Encounter for screening mammogram for malignant neoplasm of breast: Secondary | ICD-10-CM

## 2018-06-01 ENCOUNTER — Other Ambulatory Visit: Payer: Self-pay | Admitting: Obstetrics and Gynecology

## 2018-06-01 DIAGNOSIS — R928 Other abnormal and inconclusive findings on diagnostic imaging of breast: Secondary | ICD-10-CM

## 2018-06-03 ENCOUNTER — Ambulatory Visit
Admission: RE | Admit: 2018-06-03 | Discharge: 2018-06-03 | Disposition: A | Discharge status: Home / Self Care | Payer: BLUE CROSS/BLUE SHIELD | Source: Ambulatory Visit | Attending: Obstetrics and Gynecology | Admitting: Obstetrics and Gynecology

## 2018-06-03 DIAGNOSIS — R928 Other abnormal and inconclusive findings on diagnostic imaging of breast: Secondary | ICD-10-CM

## 2019-05-01 ENCOUNTER — Other Ambulatory Visit: Payer: Self-pay | Admitting: Obstetrics and Gynecology

## 2019-05-01 DIAGNOSIS — Z1231 Encounter for screening mammogram for malignant neoplasm of breast: Secondary | ICD-10-CM

## 2019-06-19 ENCOUNTER — Other Ambulatory Visit: Payer: Self-pay

## 2019-06-19 ENCOUNTER — Ambulatory Visit
Admission: RE | Admit: 2019-06-19 | Discharge: 2019-06-19 | Disposition: A | Discharge status: Home / Self Care | Payer: BC Managed Care – PPO | Source: Ambulatory Visit | Attending: Obstetrics and Gynecology | Admitting: Obstetrics and Gynecology

## 2019-06-19 DIAGNOSIS — Z1231 Encounter for screening mammogram for malignant neoplasm of breast: Secondary | ICD-10-CM

## 2019-06-20 ENCOUNTER — Other Ambulatory Visit: Payer: Self-pay | Admitting: Obstetrics and Gynecology

## 2019-06-20 DIAGNOSIS — N63 Unspecified lump in unspecified breast: Secondary | ICD-10-CM

## 2019-06-22 ENCOUNTER — Other Ambulatory Visit: Payer: Self-pay

## 2019-06-22 ENCOUNTER — Ambulatory Visit
Admission: RE | Admit: 2019-06-22 | Discharge: 2019-06-22 | Disposition: A | Discharge status: Home / Self Care | Payer: BC Managed Care – PPO | Source: Ambulatory Visit | Attending: Obstetrics and Gynecology | Admitting: Obstetrics and Gynecology

## 2019-06-22 DIAGNOSIS — N63 Unspecified lump in unspecified breast: Secondary | ICD-10-CM

## 2019-08-03 IMAGING — CT CT ANGIO CHEST
2 of 6 series · 19 of 46 positions shown · IV contrast (ISOVUE)
Comparison: Chest radiographs 0874 hours today.

CLINICAL DATA: 51-year-old female with pain initially beginning in
the cheek 1 week ago, now moving into the chest. Vomiting this
morning, nausea.

EXAM:
CT ANGIOGRAPHY CHEST WITH CONTRAST
TECHNIQUE: Multidetector CT imaging of the chest was performed using the
standard protocol during bolus administration of intravenous
contrast. Multiplanar CT image reconstructions and MIPs were
obtained to evaluate the vascular anatomy.
CONTRAST:  100mL 5FV1J4-F0P IOPAMIDOL (5FV1J4-F0P) INJECTION 76%

[Series 5: thins · axial · 0.59mm/px · z∈[-277,-13]mm · 16 of 290 slices shown]
[im 13/290  lung]
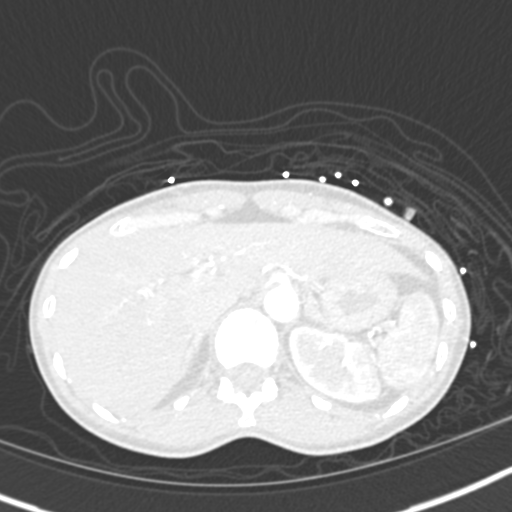
[im 38/290  soft-tissue]
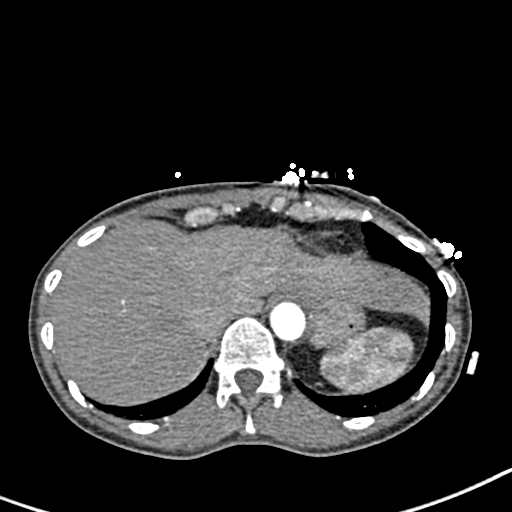
[im 51/290  lung]
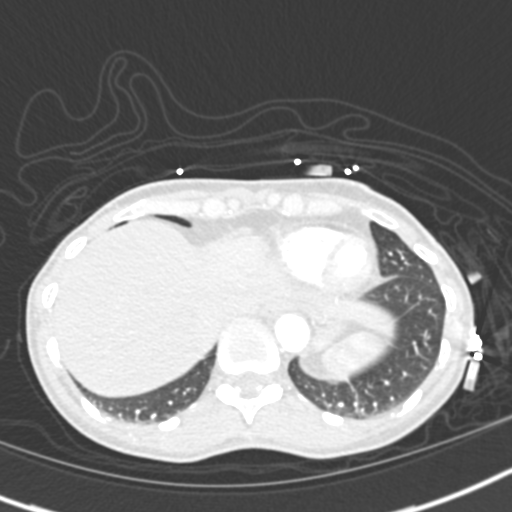
[im 63/290  soft-tissue]
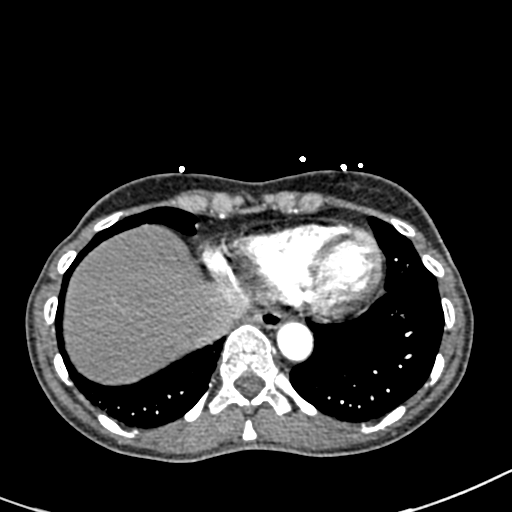
[im 88/290  lung]
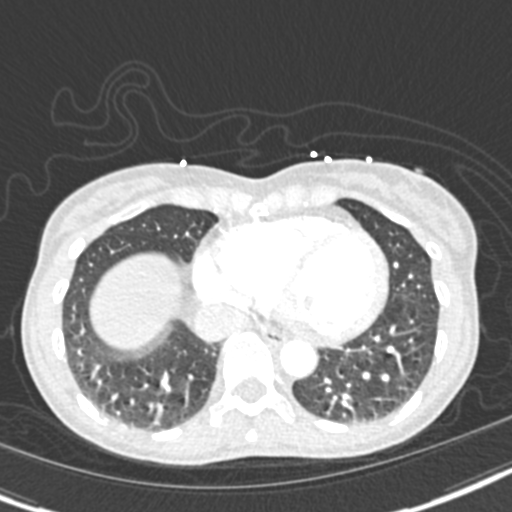
[im 101/290  soft-tissue]
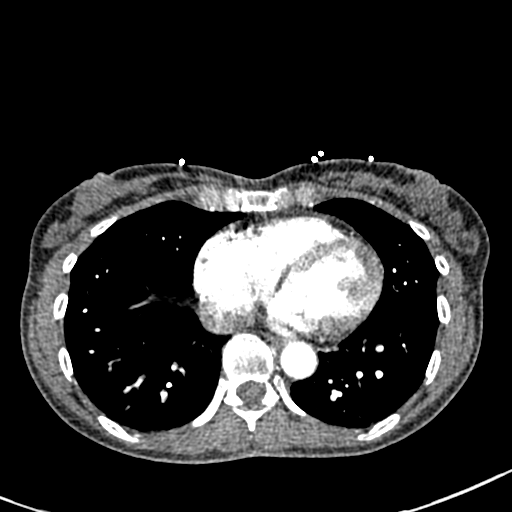
[im 114/290  lung]
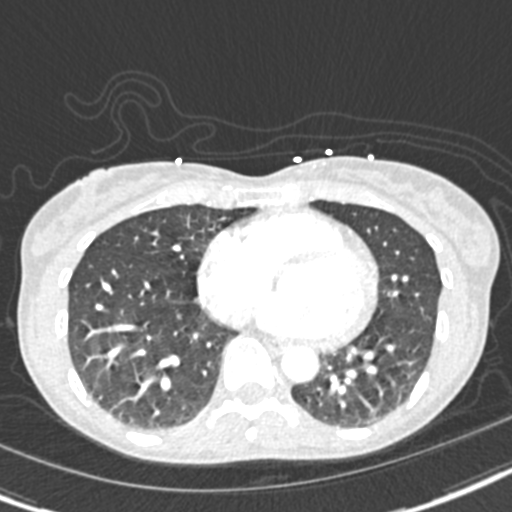
[im 139/290  soft-tissue]
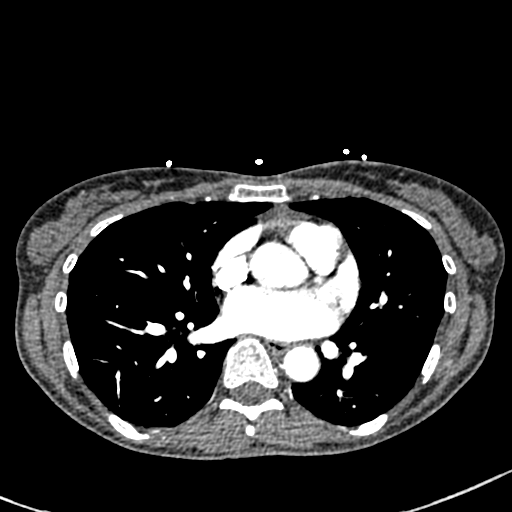
[im 151/290  lung]
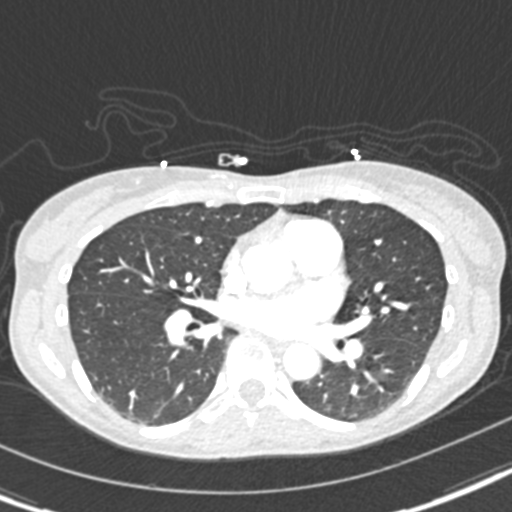
[im 176/290  soft-tissue]
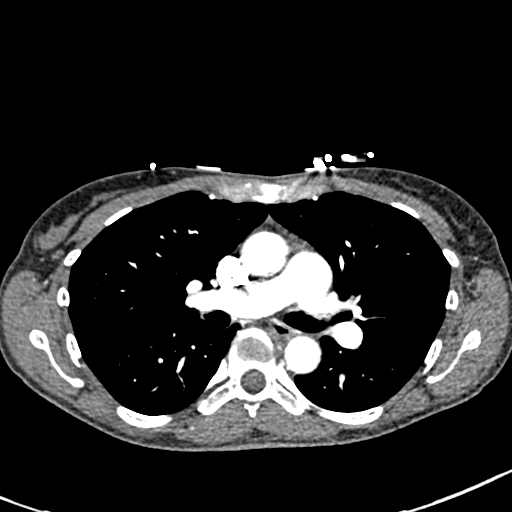
[im 189/290  lung]
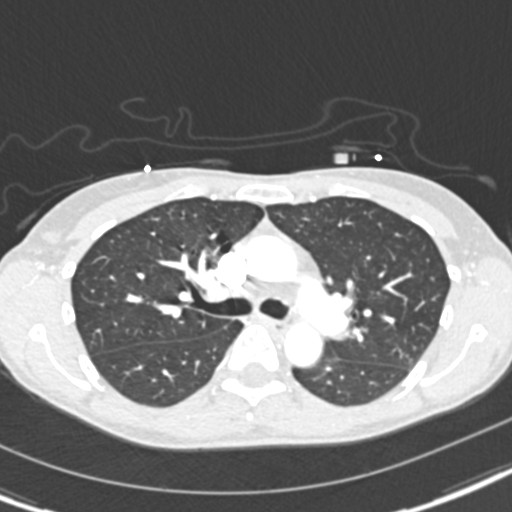
[im 202/290  soft-tissue]
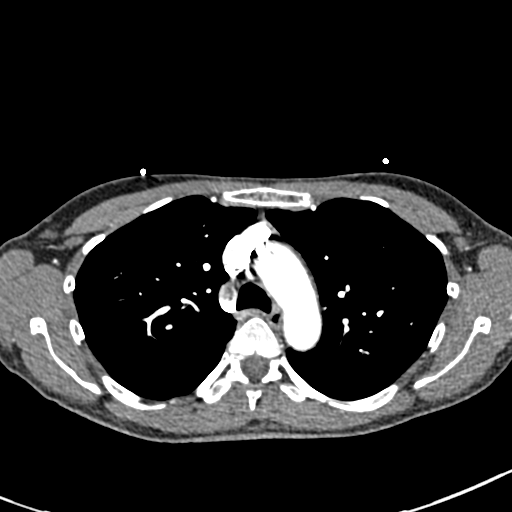
[im 227/290  lung]
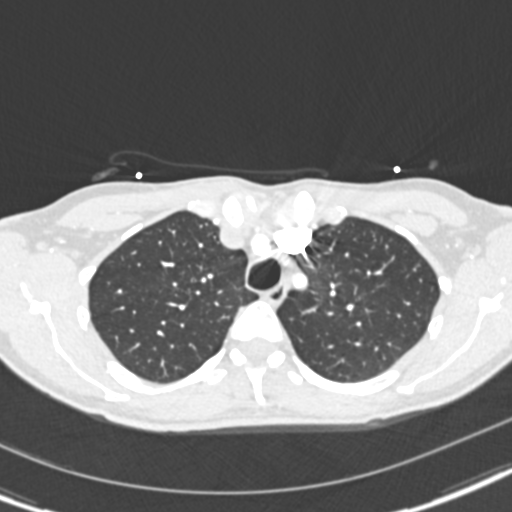
[im 239/290  soft-tissue]
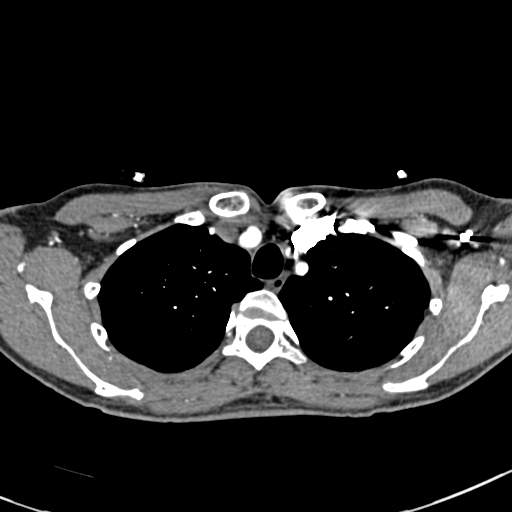
[im 252/290  lung]
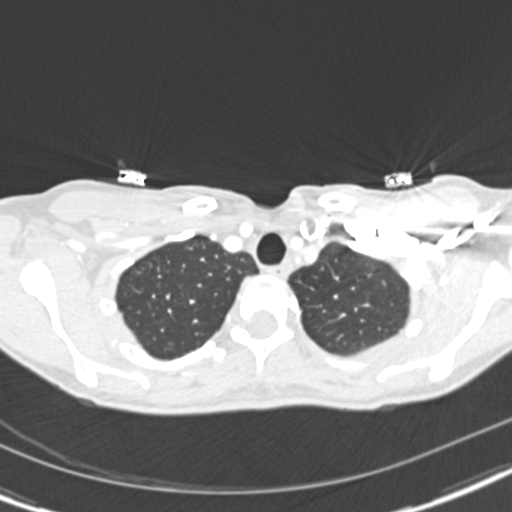
[im 277/290  soft-tissue]
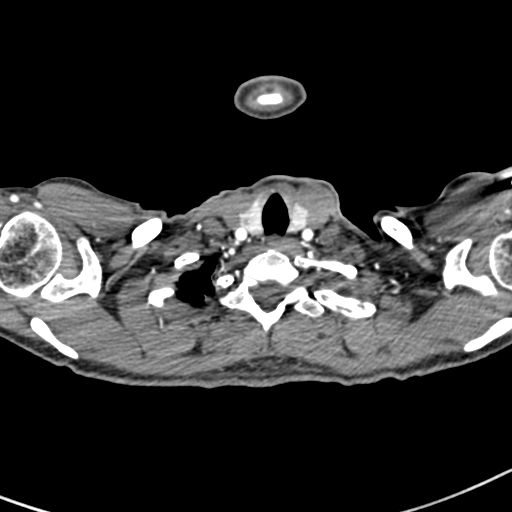

[Series 7: coronal mpr · coronal · 0.54mm/px · 3 of 88 slices shown]
[im 22/88  soft-tissue]
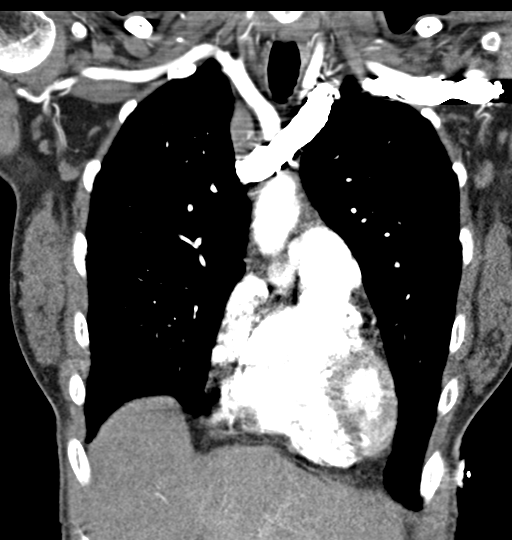
[im 44/88  soft-tissue]
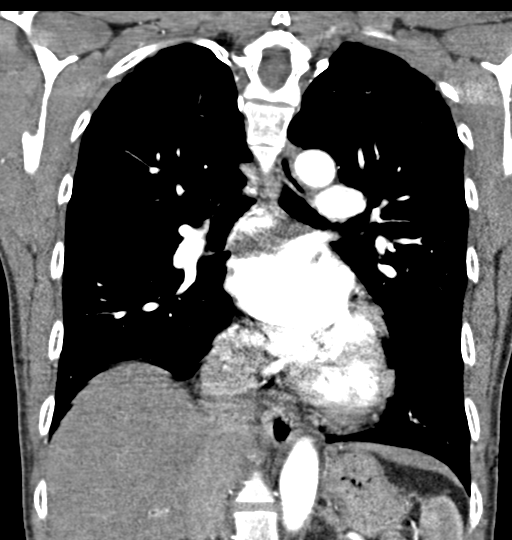
[im 66/88  soft-tissue]
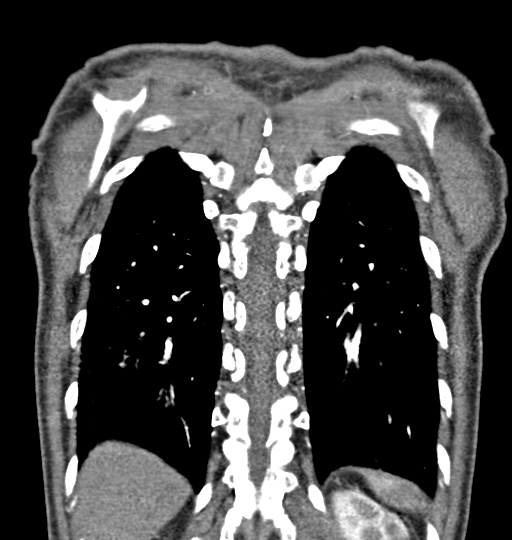

[19 of 46 positions shown; findings below may reference images not displayed]

FINDINGS: Cardiovascular: Adequate contrast bolus timing in the pulmonary
arterial tree. Mild respiratory motion in the lower lobes.

No focal filling defect identified in the pulmonary arteries to
suggest acute pulmonary embolism.

No calcified coronary artery atherosclerosis is evident. The visible
aorta is normal. Unremarkable proximal great vessels.

No cardiomegaly or pericardial effusion.

Mediastinum/Nodes: Negative.  No lymphadenopathy.

Lungs/Pleura: The major airways are patent. Both lungs are clear
aside from minor dependent atelectasis, mostly in the lower lobes.
No pleural effusion.

Upper Abdomen: Negative visible liver, spleen, adrenal glands, renal
upper poles, and stomach in the upper abdomen.

Musculoskeletal: Negative.

Review of the MIP images confirms the above findings.
IMPRESSION: Negative for acute pulmonary embolus, and negative Chest CTA. Minor
pulmonary atelectasis.

## 2020-04-05 ENCOUNTER — Other Ambulatory Visit: Payer: Self-pay | Admitting: Obstetrics and Gynecology

## 2020-04-05 DIAGNOSIS — Z1231 Encounter for screening mammogram for malignant neoplasm of breast: Secondary | ICD-10-CM

## 2020-05-24 ENCOUNTER — Other Ambulatory Visit: Payer: Self-pay | Admitting: Urology

## 2020-06-14 ENCOUNTER — Other Ambulatory Visit: Payer: Self-pay

## 2020-06-14 ENCOUNTER — Encounter (HOSPITAL_BASED_OUTPATIENT_CLINIC_OR_DEPARTMENT_OTHER): Payer: Self-pay | Admitting: Urology

## 2020-06-14 NOTE — Progress Notes (Signed)
Spoke w/ via phone for pre-op interview--- PT Lab needs dos----  Istat and Urine preg             Lab results------ no COVID test ------ 06-18-2020 @ 1420 Arrive at ------- 0800 NPO after MN NO Solid Food.  Clear liquids from MN until--- 0700 Medications to take morning of surgery ----- NONE Diabetic medication ----- n/a Patient Special Instructions ----- n/a Pre-Op special Istructions ----- n/a Patient verbalized understanding of instructions that were given at this phone interview. Patient denies shortness of breath, chest pain, fever, cough at this phone interview.

## 2020-06-18 ENCOUNTER — Other Ambulatory Visit (HOSPITAL_COMMUNITY)
Admission: RE | Admit: 2020-06-18 | Discharge: 2020-06-18 | Disposition: A | Discharge status: Home / Self Care | Payer: BC Managed Care – PPO | Source: Ambulatory Visit | Attending: Urology | Admitting: Urology

## 2020-06-18 DIAGNOSIS — Z20822 Contact with and (suspected) exposure to covid-19: Secondary | ICD-10-CM | POA: Insufficient documentation

## 2020-06-18 DIAGNOSIS — Z01812 Encounter for preprocedural laboratory examination: Secondary | ICD-10-CM | POA: Insufficient documentation

## 2020-06-18 DIAGNOSIS — N132 Hydronephrosis with renal and ureteral calculous obstruction: Secondary | ICD-10-CM | POA: Diagnosis present

## 2020-06-18 DIAGNOSIS — Z87442 Personal history of urinary calculi: Secondary | ICD-10-CM | POA: Diagnosis not present

## 2020-06-18 DIAGNOSIS — R34 Anuria and oliguria: Secondary | ICD-10-CM | POA: Diagnosis not present

## 2020-06-18 LAB — SARS CORONAVIRUS 2 (TAT 6-24 HRS): SARS Coronavirus 2: NEGATIVE

## 2020-06-20 NOTE — Anesthesia Preprocedure Evaluation (Addendum)
Anesthesia Evaluation  Patient identified by MRN, date of birth, ID band Patient awake    Reviewed: Allergy & Precautions, NPO status , Patient's Chart, lab work & pertinent test results  Airway Mallampati: II  TM Distance: >3 FB Neck ROM: Full    Dental no notable dental hx. (+) Teeth Intact, Dental Advisory Given   Pulmonary neg pulmonary ROS,    Pulmonary exam normal breath sounds clear to auscultation       Cardiovascular Exercise Tolerance: Good negative cardio ROS Normal cardiovascular exam Rhythm:Regular Rate:Normal  Nl myoview   Neuro/Psych Anxiety negative neurological ROS     GI/Hepatic hiatal hernia,   Endo/Other  negative endocrine ROS  Renal/GU Renal diseaseNephrolitihasis     Musculoskeletal negative musculoskeletal ROS (+)   Abdominal   Peds  Hematology negative hematology ROS (+)   Anesthesia Other Findings   Reproductive/Obstetrics negative OB ROS                            Anesthesia Physical Anesthesia Plan  ASA: I  Anesthesia Plan: General   Post-op Pain Management:    Induction: Intravenous  PONV Risk Score and Plan: 4 or greater and Midazolam, Ondansetron, Dexamethasone and Treatment may vary due to age or medical condition  Airway Management Planned: LMA  Additional Equipment: None  Intra-op Plan:   Post-operative Plan:   Informed Consent: I have reviewed the patients History and Physical, chart, labs and discussed the procedure including the risks, benefits and alternatives for the proposed anesthesia with the patient or authorized representative who has indicated his/her understanding and acceptance.     Dental advisory given  Plan Discussed with: CRNA and Anesthesiologist  Anesthesia Plan Comments: (LMA GA)       Anesthesia Quick Evaluation

## 2020-06-21 ENCOUNTER — Ambulatory Visit (HOSPITAL_BASED_OUTPATIENT_CLINIC_OR_DEPARTMENT_OTHER): Payer: BC Managed Care – PPO | Admitting: Anesthesiology

## 2020-06-21 ENCOUNTER — Other Ambulatory Visit: Payer: Self-pay

## 2020-06-21 ENCOUNTER — Encounter (HOSPITAL_BASED_OUTPATIENT_CLINIC_OR_DEPARTMENT_OTHER)
Admission: RE | Disposition: A | Discharge status: Home / Self Care | Payer: Self-pay | Source: Ambulatory Visit | Attending: Urology

## 2020-06-21 ENCOUNTER — Ambulatory Visit (HOSPITAL_BASED_OUTPATIENT_CLINIC_OR_DEPARTMENT_OTHER)
Admission: RE | Admit: 2020-06-21 | Discharge: 2020-06-21 | Disposition: A | Discharge status: Home / Self Care | Payer: BC Managed Care – PPO | Source: Ambulatory Visit | Attending: Urology | Admitting: Urology

## 2020-06-21 ENCOUNTER — Encounter (HOSPITAL_BASED_OUTPATIENT_CLINIC_OR_DEPARTMENT_OTHER): Payer: Self-pay | Admitting: Urology

## 2020-06-21 DIAGNOSIS — R34 Anuria and oliguria: Secondary | ICD-10-CM | POA: Insufficient documentation

## 2020-06-21 DIAGNOSIS — Z20822 Contact with and (suspected) exposure to covid-19: Secondary | ICD-10-CM | POA: Insufficient documentation

## 2020-06-21 DIAGNOSIS — N132 Hydronephrosis with renal and ureteral calculous obstruction: Secondary | ICD-10-CM | POA: Diagnosis not present

## 2020-06-21 DIAGNOSIS — Z87442 Personal history of urinary calculi: Secondary | ICD-10-CM | POA: Insufficient documentation

## 2020-06-21 HISTORY — DX: Family history of other specified conditions: Z84.89

## 2020-06-21 HISTORY — DX: Urgency of urination: R39.15

## 2020-06-21 HISTORY — DX: Hematuria, unspecified: R31.9

## 2020-06-21 HISTORY — DX: Personal history of urinary calculi: Z87.442

## 2020-06-21 HISTORY — PX: HOLMIUM LASER APPLICATION: SHX5852

## 2020-06-21 HISTORY — DX: Calculus of kidney: N20.0

## 2020-06-21 HISTORY — DX: Anxiety disorder, unspecified: F41.9

## 2020-06-21 HISTORY — DX: Diaphragmatic hernia without obstruction or gangrene: K44.9

## 2020-06-21 HISTORY — PX: CYSTOSCOPY WITH RETROGRADE PYELOGRAM, URETEROSCOPY AND STENT PLACEMENT: SHX5789

## 2020-06-21 LAB — POCT I-STAT, CHEM 8
BUN: 14 mg/dL (ref 6–20)
Calcium, Ion: 1.27 mmol/L (ref 1.15–1.40)
Chloride: 104 mmol/L (ref 98–111)
Creatinine, Ser: 0.7 mg/dL (ref 0.44–1.00)
Glucose, Bld: 93 mg/dL (ref 70–99)
HCT: 43 % (ref 36.0–46.0)
Hemoglobin: 14.6 g/dL (ref 12.0–15.0)
Potassium: 3.6 mmol/L (ref 3.5–5.1)
Sodium: 144 mmol/L (ref 135–145)
TCO2: 25 mmol/L (ref 22–32)

## 2020-06-21 LAB — POCT PREGNANCY, URINE: Preg Test, Ur: NEGATIVE

## 2020-06-21 SURGERY — CYSTOURETEROSCOPY, WITH RETROGRADE PYELOGRAM AND STENT INSERTION
Anesthesia: General | Site: Ureter | Laterality: Right

## 2020-06-21 MED ORDER — OXYCODONE-ACETAMINOPHEN 5-325 MG PO TABS
1.0000 | ORAL_TABLET | Freq: Four times a day (QID) | ORAL | 0 refills | Status: AC | PRN
Start: 2020-06-21 — End: 2021-06-21

## 2020-06-21 MED ORDER — SCOPOLAMINE 1 MG/3DAYS TD PT72
MEDICATED_PATCH | TRANSDERMAL | Status: AC
  Filled 2020-06-21: qty 1

## 2020-06-21 MED ORDER — MIDAZOLAM HCL 5 MG/5ML IJ SOLN
INTRAMUSCULAR | Status: DC | PRN
  Administered 2020-06-21: 2 mg via INTRAVENOUS

## 2020-06-21 MED ORDER — CEPHALEXIN 500 MG PO CAPS: 500.0000 mg | ORAL_CAPSULE | Freq: Two times a day (BID) | ORAL | 0 refills | Status: AC

## 2020-06-21 MED ORDER — LIDOCAINE 2% (20 MG/ML) 5 ML SYRINGE
INTRAMUSCULAR | Status: DC | PRN
  Administered 2020-06-21: 80 mg via INTRAVENOUS

## 2020-06-21 MED ORDER — EPHEDRINE SULFATE-NACL 50-0.9 MG/10ML-% IV SOSY
PREFILLED_SYRINGE | INTRAVENOUS | Status: DC | PRN
  Administered 2020-06-21: 10 mg via INTRAVENOUS

## 2020-06-21 MED ORDER — GENTAMICIN SULFATE 40 MG/ML IJ SOLN
5.0000 mg/kg | INTRAVENOUS | Status: AC
  Administered 2020-06-21: 250 mg via INTRAVENOUS
  Filled 2020-06-21: qty 6.25

## 2020-06-21 MED ORDER — SCOPOLAMINE 1 MG/3DAYS TD PT72
1.0000 | MEDICATED_PATCH | TRANSDERMAL | Status: DC
  Administered 2020-06-21: 1.5 mg via TRANSDERMAL

## 2020-06-21 MED ORDER — SENNOSIDES-DOCUSATE SODIUM 8.6-50 MG PO TABS: 1.0000 | ORAL_TABLET | Freq: Two times a day (BID) | ORAL | 0 refills | Status: AC

## 2020-06-21 MED ORDER — LIDOCAINE 2% (20 MG/ML) 5 ML SYRINGE
INTRAMUSCULAR | Status: AC
  Filled 2020-06-21: qty 5

## 2020-06-21 MED ORDER — DEXAMETHASONE SODIUM PHOSPHATE 10 MG/ML IJ SOLN
INTRAMUSCULAR | Status: AC
  Filled 2020-06-21: qty 1

## 2020-06-21 MED ORDER — ONDANSETRON HCL 4 MG/2ML IJ SOLN
INTRAMUSCULAR | Status: DC | PRN
  Administered 2020-06-21: 4 mg via INTRAVENOUS

## 2020-06-21 MED ORDER — FENTANYL CITRATE (PF) 100 MCG/2ML IJ SOLN
INTRAMUSCULAR | Status: AC
  Filled 2020-06-21: qty 2

## 2020-06-21 MED ORDER — FENTANYL CITRATE (PF) 100 MCG/2ML IJ SOLN
INTRAMUSCULAR | Status: DC | PRN
  Administered 2020-06-21 (×2): 50 ug via INTRAVENOUS

## 2020-06-21 MED ORDER — ONDANSETRON HCL 4 MG/2ML IJ SOLN
INTRAMUSCULAR | Status: AC
  Filled 2020-06-21: qty 2

## 2020-06-21 MED ORDER — EPHEDRINE 5 MG/ML INJ
INTRAVENOUS | Status: AC
  Filled 2020-06-21: qty 10

## 2020-06-21 MED ORDER — IOHEXOL 300 MG/ML  SOLN
INTRAMUSCULAR | Status: DC | PRN
  Administered 2020-06-21: 40 mL via URETHRAL

## 2020-06-21 MED ORDER — DEXAMETHASONE SODIUM PHOSPHATE 10 MG/ML IJ SOLN
INTRAMUSCULAR | Status: DC | PRN
  Administered 2020-06-21: 10 mg via INTRAVENOUS

## 2020-06-21 MED ORDER — KETOROLAC TROMETHAMINE 10 MG PO TABS: 10.0000 mg | ORAL_TABLET | Freq: Three times a day (TID) | ORAL | 0 refills | Status: AC | PRN

## 2020-06-21 MED ORDER — PROPOFOL 10 MG/ML IV BOLUS
INTRAVENOUS | Status: AC
  Filled 2020-06-21: qty 20

## 2020-06-21 MED ORDER — PROPOFOL 10 MG/ML IV BOLUS
INTRAVENOUS | Status: DC | PRN
  Administered 2020-06-21: 110 mg via INTRAVENOUS

## 2020-06-21 MED ORDER — MIDAZOLAM HCL 2 MG/2ML IJ SOLN
INTRAMUSCULAR | Status: AC
  Filled 2020-06-21: qty 2

## 2020-06-21 MED ORDER — LACTATED RINGERS IV SOLN: INTRAVENOUS | Status: DC

## 2020-06-21 SURGICAL SUPPLY — 27 items
BAG DRAIN URO-CYSTO SKYTR STRL (DRAIN) ×3 IMPLANT
BAG DRN UROCATH (DRAIN) ×2
BASKET LASER NITINOL 1.9FR (BASKET) ×1 IMPLANT
BSKT STON RTRVL 120 1.9FR (BASKET) ×2
CATH INTERMIT  6FR 70CM (CATHETERS) IMPLANT
CLOTH BEACON ORANGE TIMEOUT ST (SAFETY) ×3 IMPLANT
FIBER LASER FLEXIVA 365 (UROLOGICAL SUPPLIES) IMPLANT
FIBER LASER TRAC TIP (UROLOGICAL SUPPLIES) IMPLANT
GLOVE BIO SURGEON STRL SZ7.5 (GLOVE) ×3 IMPLANT
GLOVE BIOGEL PI IND STRL 6.5 (GLOVE) IMPLANT
GLOVE BIOGEL PI INDICATOR 6.5 (GLOVE) ×1
GLOVE ECLIPSE 6.5 STRL STRAW (GLOVE) ×1 IMPLANT
GOWN STRL REUS W/TWL LRG LVL3 (GOWN DISPOSABLE) ×4 IMPLANT
GUIDEWIRE ANG ZIPWIRE 038X150 (WIRE) ×4 IMPLANT
GUIDEWIRE STR DUAL SENSOR (WIRE) ×4 IMPLANT
IV NS 1000ML (IV SOLUTION)
IV NS 1000ML BAXH (IV SOLUTION) ×2 IMPLANT
IV NS IRRIG 3000ML ARTHROMATIC (IV SOLUTION) ×3 IMPLANT
KIT TURNOVER CYSTO (KITS) ×3 IMPLANT
MANIFOLD NEPTUNE II (INSTRUMENTS) ×3 IMPLANT
NS IRRIG 500ML POUR BTL (IV SOLUTION) ×3 IMPLANT
PACK CYSTO (CUSTOM PROCEDURE TRAY) ×3 IMPLANT
STENT POLARIS 5FRX22 (STENTS) ×1 IMPLANT
SYR 10ML LL (SYRINGE) ×2 IMPLANT
TUBE CONNECTING 12X1/4 (SUCTIONS) ×3 IMPLANT
TUBE FEEDING 8FR 16IN STR KANG (MISCELLANEOUS) ×1 IMPLANT
TUBING UROLOGY SET (TUBING) ×3 IMPLANT

## 2020-06-21 NOTE — H&P (Signed)
Christine Cooley is an 54 y.o. female.    Chief Complaint: Pre-OP RIGHT ureteroscopy  HPI:   1 - Recurrent Nephrolithiasis -  Pre 2019 - ureteroscopy x 1, medical passage x several  05/2018 - 2mm RLP non-obsructing renal stone only.   05/2020 - CT Rt mid 67mm and RLP non-obstructing (about 41mm) on stone CT   2 - Medical Stone Disease / Oliguria -  BMP - normal (PTH, Urate not on file); Composition - CaOx; 24 Hr Urienes - VERY LOW voluem <600cc/day   3 -Gross Hematuria - few episodes gross hematurai 2021. Non smoker. CT with Rt non-obstructing renal stones (non-con). UCX non-clonal .   PMH sig for non-cardiac chest pain (negative CV eval 2019), tennis elbox, anxiety. NO CV disease / blood thinners. Her PCP is Christine Drown NP   Today "Christine Cooley" is seen to proceed with cysto, bilateral retrogardes, right ureteroscopy to address her multifocal right stones and complete hematuria evaluation. No interval fevers. C19 screen negative. Most recent UA without infectious parameters.   Past Medical History:  Diagnosis Date  . Anxiety   . Family history of adverse reaction to anesthesia    mother-- ponv  . Hematuria   . Hiatal hernia   . History of kidney stones   . History of nuclear stress test 11/26/2017   ED visit in epic 11-11-2017 for chest pain, abnormal ekg, heart racing;  pt followed up with cardiology, K. Lawrence NP note 11-25-2017 in epic , ekg ok, atypical chest pain, anxiety;  normal stress test with no evidence ishemia nuclear ef 60%  . Renal calculus, right   . Urgency of urination     Past Surgical History:  Procedure Laterality Date  . COLONOSCOPY WITH PROPOFOL  2010 approx  . CYSTOSCOPY/RETROGRADE/URETEROSCOPY/STONE EXTRACTION WITH BASKET Left 02-01-2006  @[facility]   . ENDOMETRIAL ABLATION  2006  . ESOPHAGOGASTRODUODENOSCOPY  2017 approx  . TONSILLECTOMY  age 55    Family History  Problem Relation Age of Onset  . Alzheimer's disease Father   . COPD Father    Social  History:  reports that she has never smoked. She has never used smokeless tobacco. She reports that she does not drink alcohol and does not use drugs.  Allergies:  Allergies  Allergen Reactions  . Ciprofloxacin Nausea Only and Other (See Comments)    And headache  . Erythromycin Nausea And Vomiting    No medications prior to admission.    No results found for this or any previous visit (from the past 48 hour(s)). No results found.  Review of Systems  Constitutional: Negative for chills and fever.  Genitourinary: Positive for hematuria.  All other systems reviewed and are negative.   Height 5\' 4"  (1.626 m), weight 50.8 kg. Physical Exam Vitals reviewed.  HENT:     Head: Normocephalic.     Nose: Nose normal.  Cardiovascular:     Rate and Rhythm: Normal rate.  Pulmonary:     Effort: Pulmonary effort is normal.  Abdominal:     General: Abdomen is flat.  Genitourinary:    Comments: No CVAT at present.  Musculoskeletal:        General: Normal range of motion.     Cervical back: Normal range of motion.  Skin:    General: Skin is warm.  Neurological:     General: No focal deficit present.     Mental Status: She is alert.      Assessment/Plan  Proceed as planned with cysto, BILATERAL retrogrades  and RIGHT ureteroscopy with gaol of stone free. Risks, benefits, alternatives, expected peri-op course discussed previously and reiterated today.   Sebastian Ache, MD 06/21/2020, 6:37 AM

## 2020-06-21 NOTE — Anesthesia Postprocedure Evaluation (Signed)
Anesthesia Post Note  Patient: Colena Ketterman [place]  Procedure(s) Performed: CYSTOSCOPY WITH BILATERAL RETROGRADE PYELOGRAMS RIGHT, URETEROSCOPY AND RIGHT STENT PLACEMENT (Bilateral Bladder) HOLMIUM LASER APPLICATION (Right Ureter)     Patient location during evaluation: PACU Anesthesia Type: General Level of consciousness: awake and alert Pain management: pain level controlled Vital Signs Assessment: post-procedure vital signs reviewed and stable Respiratory status: spontaneous breathing, nonlabored ventilation, respiratory function stable and patient connected to nasal cannula oxygen Cardiovascular status: blood pressure returned to baseline and stable Postop Assessment: no apparent nausea or vomiting Anesthetic complications: no   No complications documented.  Last Vitals:  Vitals:   06/21/20 1115 06/21/20 1145  BP: 121/85 120/80  Pulse: 82 72  Resp: 15 16  Temp:  36.7 C  SpO2: 96% 98%    Last Pain:  Vitals:   06/21/20 1145  TempSrc:   PainSc: 0-No pain                 Trevor Iha

## 2020-06-21 NOTE — Discharge Instructions (Signed)
[facility] [phone] Post Ureteroscopy With or Without Stent Instructions  Definitions:  Ureter: The duct that transports urine from the kidney to the bladder. Stent:   A plastic hollow tube that is placed into the ureter, from the kidney to the bladder to prevent the ureter from swelling shut.  GENERAL INSTRUCTIONS:  Despite the fact that no skin incisions were used, the area around the ureter and bladder is raw and irritated. The stent is a foreign body which will further irritate the bladder wall. This irritation is manifested by increased frequency of urination, both day and night, and by an increase in the urge to urinate. In some, the urge to urinate is present almost always. Sometimes the urge is strong enough that you may not be able to stop yourself from urinating. The only real cure is to remove the stent and then give time for the bladder wall to heal which can't be done until the danger of the ureter swelling shut has passed, which varies.  You may see some blood in your urine while the stent is in place and a few days afterwards. Do not be alarmed, even if the urine was clear for a while. Get off your feet and drink lots of fluids until clearing occurs. If you start to pass clots or don't improve, call us.  DIET: You may return to your normal diet immediately. Because of the raw surface of your bladder, alcohol, spicy foods, acid type foods and drinks with caffeine may cause irritation or frequency and should be used in moderation. To keep your urine flowing freely and to avoid constipation, drink plenty of fluids during the day ( 8-10 glasses ). Tip: Avoid cranberry juice because it is very acidic.  ACTIVITY: Your physical activity doesn't need to be restricted. However, if you are very active, you may see some blood in your urine. We suggest that you reduce your activity under these circumstances until the bleeding has stopped.  BOWELS: It is important to  keep your bowels regular during the postoperative period. Straining with bowel movements can cause bleeding. A bowel movement every other day is reasonable. Use a mild laxative if needed, such as Milk of Magnesia 2-3 tablespoons, or 2 Dulcolax tablets. Call if you continue to have problems. If you have been taking narcotics for pain, before, during or after your surgery, you may be constipated. Take a laxative if necessary.   MEDICATION: You should resume your pre-surgery medications unless told not to. In addition you will often be given an antibiotic to prevent infection. These should be taken as prescribed until the bottles are finished unless you are having an unusual reaction to one of the drugs.  PROBLEMS YOU SHOULD REPORT TO [place]:  Fevers over 100.5 Fahrenheit.  Heavy bleeding, or clots ( See above notes about blood in urine ).  Inability to urinate.  Drug reactions ( hives, rash, nausea, vomiting, diarrhea ).  Severe burning or pain with urination that is not improving.  FOLLOW-UP: You will need a follow-up appointment to monitor your progress. Call for this appointment at the number listed above. Usually the first appointment will be about three to fourteen days after your surgery.     1 - You may have urinary urgency (bladder spasms) and bloody urine on / off with stent in place. This is normal.  2 - Call MD or go to ER for fever >102, severe pain / nausea / vomiting not relieved by medications, or acute change in  medical status  Post Anesthesia Home Care Instructions  Activity: Get plenty of rest for the remainder of the day. A responsible adult should stay with you for 24 hours following the procedure.  For the next 24 hours, DO NOT: -Drive a car -Advertising copywriter -Drink alcoholic beverages -Take any medication unless instructed by your physician -Make any legal decisions or sign important papers.  Meals: Start with liquid foods such as gelatin or soup. Progress to  regular foods as tolerated. Avoid greasy, spicy, heavy foods. If nausea and/or vomiting occur, drink only clear liquids until the nausea and/or vomiting subsides. Call your physician if vomiting continues.  Special Instructions/Symptoms: Your throat may feel dry or sore from the anesthesia or the breathing tube placed in your throat during surgery. If this causes discomfort, gargle with warm salt water. The discomfort should disappear within 24 hours.  If you had a scopolamine patch placed behind your ear for the management of post- operative nausea and/or vomiting:  1. The medication in the patch is effective for 72 hours, after which it should be removed.  Wrap patch in a tissue and discard in the trash. Wash hands thoroughly with soap and water. 2. You may remove the patch earlier than 72 hours if you experience unpleasant side effects which may include dry mouth, dizziness or visual disturbances. 3. Avoid touching the patch. Wash your hands with soap and water after contact with the patch.

## 2020-06-21 NOTE — Anesthesia Procedure Notes (Signed)
Procedure Name: LMA Insertion Date/Time: 06/21/2020 9:50 AM Performed by: Bishop Limbo, CRNA Pre-anesthesia Checklist: Patient identified, Emergency Drugs available, Suction available and Patient being monitored Patient Re-evaluated:Patient Re-evaluated prior to induction Oxygen Delivery Method: Circle System Utilized Preoxygenation: Pre-oxygenation with 100% oxygen Induction Type: IV induction Ventilation: Mask ventilation without difficulty LMA: LMA inserted LMA Size: 4.0 Number of attempts: 1 Placement Confirmation: positive ETCO2 Tube secured with: Tape Dental Injury: Teeth and Oropharynx as per pre-operative assessment

## 2020-06-21 NOTE — Brief Op Note (Signed)
06/21/2020  10:29 AM  PATIENT:  Christine Cooley  54 y.o. female  PRE-OPERATIVE DIAGNOSIS:  RIGHT RENAL STONES, HEMATURIA  POST-OPERATIVE DIAGNOSIS:  RIGHT RENAL STONES, HEMATURIA  PROCEDURE:  Procedure(s) with comments: CYSTOSCOPY WITH BILATERAL RETROGRADE PYELOGRAMS RIGHT, URETEROSCOPY AND RIGHT STENT PLACEMENT (Bilateral) - 75 MINS HOLMIUM LASER APPLICATION (Right)  SURGEON:  Surgeon(s) and Role:    Sebastian Ache, MD - Primary  PHYSICIAN ASSISTANT:   ASSISTANTS: none   ANESTHESIA:   general  EBL:  minimal   BLOOD ADMINISTERED:none  DRAINS: none   LOCAL MEDICATIONS USED:  NONE  SPECIMEN:  Source of Specimen:  Rt ureteral stone fragments  DISPOSITION OF SPECIMEN:  [facility] for compositional analysis  COUNTS:  YES  TOURNIQUET:  * No tourniquets in log *  DICTATION: .Other Dictation: Dictation Number  [id number]  PLAN OF CARE: Discharge to home after PACU  PATIENT DISPOSITION:  PACU - hemodynamically stable.   Delay start of Pharmacological VTE agent (>24hrs) due to surgical blood loss or risk of bleeding: yes

## 2020-06-21 NOTE — Transfer of Care (Signed)
Immediate Anesthesia Transfer of Care Note  Patient: Christine Cooley  Procedure(s) Performed: CYSTOSCOPY WITH BILATERAL RETROGRADE PYELOGRAMS RIGHT, URETEROSCOPY AND RIGHT STENT PLACEMENT (Bilateral Bladder) HOLMIUM LASER APPLICATION (Right Ureter)  Patient Location: PACU  Anesthesia Type:General  Level of Consciousness: drowsy and patient cooperative  Airway & Oxygen Therapy: Patient Spontanous Breathing and Patient connected to nasal cannula oxygen  Post-op Assessment: Report given to RN and Post -op Vital signs reviewed and stable  Post vital signs: Reviewed and stable  Last Vitals:  Vitals Value Taken Time  BP    Temp    Pulse 74 06/21/20 1037  Resp 11 06/21/20 1037  SpO2 98 % 06/21/20 1037  Vitals shown include unvalidated device data.  Last Pain:  Vitals:   06/21/20 0841  TempSrc: Oral  PainSc: 0-No pain      Patients Stated Pain Goal: 3 (06/21/20 0841)  Complications: No complications documented.

## 2020-06-24 ENCOUNTER — Encounter (HOSPITAL_BASED_OUTPATIENT_CLINIC_OR_DEPARTMENT_OTHER): Payer: Self-pay | Admitting: Urology

## 2020-06-24 NOTE — Op Note (Signed)
NAME: Christine Cooley, Christine Cooley. MEDICAL RECORD [id number] ACCOUNT [id number] DATE OF BIRTH:November 14, 1965 FACILITY: [facility] LOCATION: [facility] PHYSICIAN:Rea Reser, MD  OPERATIVE REPORT  DATE OF PROCEDURE:  06/21/2020  PREOPERATIVE DIAGNOSES:  Right-sided urolithiasis, hematuria, flank pain.  PROCEDURE: 1.  Cystoscopy, bilateral pyelograms in duration. 2.  Right ureteroscopy with laser lithotripsy. 3.  Insertion of right ureteral stent 5 x 22 Polaris, no tether.  ESTIMATED BLOOD LOSS:  Nil.  COMPLICATIONS:  None.  SPECIMENS:  Right ureteral stone fragments for composition analysis.  FINDINGS: 1.  Bifid system left kidney.  No filling defects or narrowing. 2.  Impacted right mid ureteral stone with significant hydronephrosis and tortuosity as well as right mid and lower pole renal stones. 3.  Complete resolution of all accessible stone fragments larger than one-third mm following laser lithotripsy and basket extraction. 4.  Successful placement of right ureteral stent, proximal end in renal pelvis, distal end in urinary bladder.  INDICATIONS:  The patient is a pleasant 54 year old woman with a remote history of urolithiasis.  She was found on workup of flank pain there was occasional gross hematuria to have recurrence of right-sided stones.  There were no obvious obstructing  stones at the time of her imaging; however, she corroborated the history of significant flank pain as well as gross hematuria.  Options were discussed for management including recommended path of ureteroscopy with goal of right-sided stone free and  bilateral retrograde pyelograms to complete her hematuria evaluation.  She wished to proceed.  Informed consent was obtained and placed in medical record.  DESCRIPTION OF PROCEDURE:  The patient being identified herself, the procedure being cystoscopy, bilateral retrograde, right ureteroscope for stent placement was confirmed.  Procedure timeout was performed.  Intravenous  access administered.  General  anesthesia introduced.  The patient was placed into a low lithotomy position, sterile field was created, prepped and draped the patient's vagina, introitus and proximal thighs using iodine.  Cystourethroscopy was performed using 21-French rigid  cystoscope with offset lens.  Inspection of bladder revealed no diverticula, calcifications or papillary lesions.  The left ureteral orifice was cannulated with a 6-French renal catheter and left retrograde pyelogram was obtained.  Left retrograde pyelogram demonstrated a single left ureter with a bifid system left kidney.  There were no filling defects or narrowing noted.  The ureteral anatomy was quite delicate, but without strictures.  Right retrograde pyelogram was obtained.  Right retrograde pyelogram demonstrated a single right ureter with single system right kidney.  There was a filling defect in the midureter with significant hydronephrosis and tortuosity above this most consistent with likely impacted stone.  A 0.038 ZIP  wire was advanced to level of lower pole set aside as a safety wire.  An 8-French feeding tube was placed in the urinary bladder for pressure release.  Semirigid ureteroscopy performed in the distal right ureter alongside a separate sensor working wire.   As expected in the midureter, there was significantly impacted stone that was not very large.  It was only around 3-4 mm.  However, there was significant mucosal edema around this and hydronephrosis above this.  The stone was amenable to simple  basketing.  It was grasped on its long axis, removed in its entirety, set aside for composition analysis.  Repeat semirigid ureteroscopy of the distal four-fifths of the right ureter revealed no additional stone fragments in this area.  Again, there was  some tortuosity close to the area of the renal pelvis and a sensor wire was advanced to the  level of the renal pelvis and the semirigid scope was exchanged for a  flexible digital ureteroscope using continuous fluoroscopic guidance to the level of the  kidney.  Notably, an access sheath was not used as the area of prior stone impaction was very narrow.  It was felt that the caliber would certainly not allow passage of that.  The right kidney was inspected including all calices x3.  There was  significant tortuosity with hydronephrosis and blunting of calices.  This was quite impressive.  There were 2 foci of calcifications that were essentially papillary tip, one in an upper mid pole and another in the extreme lower pole.  These were not  amenable to simple basketing.  As such, holmium laser energy applied 70 using settings of 0.2 joules and 20 Hz and using dusting technique, these foci were dusted into pieces less than one-third mm.  Given the dusting technique for her kidney and the  significant narrowing at the prior site of stone impaction that is due to her recent edema,  it was felt that interval stenting with a non-tethered stent would be most prudent.  As such, the ureteroscope was removed under continuous vision.  No  additional mucosal findings were found, and a new 5 x 22 Polaris-type stent was placed over the remaining safety wire on the right side using fluoroscopic guidance.  Good proximal and distal plane were noted, and the procedure was terminated.  The  patient tolerated the procedure well.  No immediate complications.  The patient taken postanesthesia care in stable condition.  Plan for discharge home.  IN/NUANCE  D:06/21/2020 T:06/22/2020 JOB:[id number]

## 2020-06-25 ENCOUNTER — Other Ambulatory Visit: Payer: Self-pay

## 2020-06-25 ENCOUNTER — Ambulatory Visit
Admission: RE | Admit: 2020-06-25 | Discharge: 2020-06-25 | Disposition: A | Discharge status: Home / Self Care | Payer: BC Managed Care – PPO | Source: Ambulatory Visit | Attending: Obstetrics and Gynecology | Admitting: Obstetrics and Gynecology

## 2020-06-25 DIAGNOSIS — Z1231 Encounter for screening mammogram for malignant neoplasm of breast: Secondary | ICD-10-CM

## 2021-03-24 ENCOUNTER — Other Ambulatory Visit: Payer: Self-pay | Admitting: Obstetrics and Gynecology

## 2021-03-24 DIAGNOSIS — Z1231 Encounter for screening mammogram for malignant neoplasm of breast: Secondary | ICD-10-CM

## 2021-06-30 ENCOUNTER — Ambulatory Visit: Payer: BC Managed Care – PPO

## 2021-07-02 ENCOUNTER — Ambulatory Visit
Admission: RE | Admit: 2021-07-02 | Discharge: 2021-07-02 | Disposition: A | Discharge status: Home / Self Care | Payer: BC Managed Care – PPO | Source: Ambulatory Visit | Attending: Obstetrics and Gynecology | Admitting: Obstetrics and Gynecology

## 2021-07-02 DIAGNOSIS — Z1231 Encounter for screening mammogram for malignant neoplasm of breast: Secondary | ICD-10-CM

## 2022-03-11 ENCOUNTER — Other Ambulatory Visit: Payer: Self-pay | Admitting: *Deleted

## 2022-03-11 DIAGNOSIS — E039 Hypothyroidism, unspecified: Secondary | ICD-10-CM

## 2022-03-12 ENCOUNTER — Ambulatory Visit
Admission: RE | Admit: 2022-03-12 | Discharge: 2022-03-12 | Disposition: A | Discharge status: Home / Self Care | Payer: BC Managed Care – PPO | Source: Ambulatory Visit | Attending: *Deleted | Admitting: *Deleted

## 2022-03-12 DIAGNOSIS — E039 Hypothyroidism, unspecified: Secondary | ICD-10-CM

## 2022-06-02 ENCOUNTER — Other Ambulatory Visit: Payer: Self-pay | Admitting: Obstetrics and Gynecology

## 2022-06-02 DIAGNOSIS — Z1231 Encounter for screening mammogram for malignant neoplasm of breast: Secondary | ICD-10-CM

## 2022-07-22 ENCOUNTER — Ambulatory Visit
Admission: RE | Admit: 2022-07-22 | Discharge: 2022-07-22 | Disposition: A | Discharge status: Home / Self Care | Payer: BC Managed Care – PPO | Source: Ambulatory Visit | Attending: Obstetrics and Gynecology | Admitting: Obstetrics and Gynecology

## 2022-07-22 ENCOUNTER — Ambulatory Visit: Payer: BC Managed Care – PPO

## 2022-07-22 DIAGNOSIS — Z1231 Encounter for screening mammogram for malignant neoplasm of breast: Secondary | ICD-10-CM

## 2022-08-06 ENCOUNTER — Ambulatory Visit: Payer: BC Managed Care – PPO

## 2022-09-17 ENCOUNTER — Ambulatory Visit: Payer: BC Managed Care – PPO

## 2023-06-25 ENCOUNTER — Other Ambulatory Visit: Payer: Self-pay | Admitting: *Deleted

## 2023-06-25 DIAGNOSIS — M545 Low back pain, unspecified: Secondary | ICD-10-CM

## 2023-07-11 ENCOUNTER — Other Ambulatory Visit: Payer: BC Managed Care – PPO

## 2023-07-14 ENCOUNTER — Other Ambulatory Visit: Payer: BC Managed Care – PPO

## 2023-12-16 ENCOUNTER — Other Ambulatory Visit: Payer: Self-pay | Admitting: *Deleted

## 2023-12-16 ENCOUNTER — Ambulatory Visit
Admission: RE | Admit: 2023-12-16 | Discharge: 2023-12-16 | Disposition: A | Discharge status: Home / Self Care | Source: Ambulatory Visit | Attending: *Deleted | Admitting: *Deleted

## 2023-12-16 DIAGNOSIS — M79602 Pain in left arm: Secondary | ICD-10-CM
